# Patient Record
Sex: Female | Born: 1980
Health system: Southern US, Community
[De-identification: ages and names within clinical notes are randomized; demographics above are authoritative.]

## PROBLEM LIST (undated history)

## (undated) ENCOUNTER — Inpatient Hospital Stay (HOSPITAL_COMMUNITY): Payer: Self-pay

## (undated) DIAGNOSIS — K219 Gastro-esophageal reflux disease without esophagitis: Secondary | ICD-10-CM

## (undated) DIAGNOSIS — F329 Major depressive disorder, single episode, unspecified: Secondary | ICD-10-CM

## (undated) DIAGNOSIS — F32A Depression, unspecified: Secondary | ICD-10-CM

## (undated) DIAGNOSIS — R002 Palpitations: Secondary | ICD-10-CM

## (undated) DIAGNOSIS — Z8619 Personal history of other infectious and parasitic diseases: Secondary | ICD-10-CM

## (undated) HISTORY — DX: Palpitations: R00.2

## (undated) HISTORY — DX: Personal history of other infectious and parasitic diseases: Z86.19

## (undated) HISTORY — DX: Major depressive disorder, single episode, unspecified: F32.9

## (undated) HISTORY — DX: Gastro-esophageal reflux disease without esophagitis: K21.9

## (undated) HISTORY — DX: Depression, unspecified: F32.A

## (undated) HISTORY — PX: WISDOM TOOTH EXTRACTION: SHX21

---

## 2004-11-17 ENCOUNTER — Emergency Department (HOSPITAL_COMMUNITY): Admission: EM | Admit: 2004-11-17 | Discharge: 2004-11-17 | Payer: Self-pay | Admitting: Emergency Medicine

## 2007-11-03 HISTORY — PX: SHOULDER BIOPSY: SHX2404

## 2008-06-27 ENCOUNTER — Emergency Department (HOSPITAL_COMMUNITY): Admission: EM | Admit: 2008-06-27 | Discharge: 2008-06-27 | Payer: Self-pay | Admitting: Emergency Medicine

## 2009-08-30 DIAGNOSIS — R000 Tachycardia, unspecified: Secondary | ICD-10-CM

## 2009-08-30 DIAGNOSIS — Z8719 Personal history of other diseases of the digestive system: Secondary | ICD-10-CM

## 2009-08-31 DIAGNOSIS — E7800 Pure hypercholesterolemia, unspecified: Secondary | ICD-10-CM

## 2009-09-01 ENCOUNTER — Ambulatory Visit: Payer: Self-pay | Admitting: Cardiology

## 2009-09-01 DIAGNOSIS — R002 Palpitations: Secondary | ICD-10-CM

## 2009-09-21 ENCOUNTER — Encounter: Payer: Self-pay | Admitting: Cardiology

## 2009-09-21 ENCOUNTER — Ambulatory Visit: Payer: Self-pay | Admitting: Cardiology

## 2009-09-21 ENCOUNTER — Ambulatory Visit: Payer: Self-pay

## 2009-09-21 ENCOUNTER — Encounter (INDEPENDENT_AMBULATORY_CARE_PROVIDER_SITE_OTHER): Payer: Self-pay | Admitting: *Deleted

## 2009-09-21 ENCOUNTER — Ambulatory Visit (HOSPITAL_COMMUNITY): Admission: RE | Admit: 2009-09-21 | Discharge: 2009-09-21 | Payer: Self-pay | Admitting: Cardiology

## 2010-07-03 ENCOUNTER — Inpatient Hospital Stay (HOSPITAL_COMMUNITY)
Admission: AD | Admit: 2010-07-03 | Discharge: 2010-07-03 | Payer: Self-pay | Source: Home / Self Care | Attending: Obstetrics and Gynecology | Admitting: Obstetrics and Gynecology

## 2010-07-03 LAB — KLEIHAUER-BETKE STAIN: Fetal Cells %: 0 %

## 2010-07-04 NOTE — Letter (Signed)
Summary: [facility] - Outpatient Coins[facility] Notice  [facility]   Imported By: Marylou Mccoy 09/22/2009 12:03:25  _____________________________________________________________________  External Attachment:    Type:   Image     Comment:   External Document

## 2010-07-04 NOTE — Assessment & Plan Note (Signed)
Summary: NP6/INTERM TACHYCARDIA/JML   Visit Type:  Initial Consult Referring Provider:  Tomblin   History of Present Illness: Dorothy Aguilar send a day referred by Dr. Henderson Cloud or palpitations.  He's been occurring for several months. 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 Generally occur when she's sitting in her car.  They do not occur with exercise or exertion.  She is a former Warehouse manager. She has not been working out a regular basis. She's gained about 20 some pounds.  She denies any drug use, excess alcohol use, and has cut back on her caffeine.  There is no history of sudden death in her family. There is no premature history of coronary disease.  She denies orthopnea, PND or peripheral edema. She's had no syncope.  Current Medications (verified): 1)  Allegra 180 Mg Tabs (Fexofenadine Hcl) .Marland Kitchen.. 1 By Mouth Daily 2)  Ortho Tri-Cyclen (28) 0.18/0.215/0.25 Mg-35 Mcg Tabs (Norgestim-Eth Estrad Triphasic) .Marland Kitchen.. 1 By Mouth Daily 3)  Zantac 75 75 Mg Tabs (Ranitidine Hcl) .Marland Kitchen.. 1 By Mouth Daily  Allergies (verified): 1)  ! Asa 2)  ! Sulfa 3)  ! Codeine 4)  ! Penicillin  Past History:  Past Medical History: Last updated: 08/30/2009 TACHYCARDIA (ICD-785.0) CORONARY ARTERY DISEASE, FAMILY HX (ICD-V17.3) HYPERLIPIDEMIA, FAMILIAL (ICD-272.0) RECTAL BLEEDING, HX OF (ICD-V12.79)  Past Surgical History: Last updated: 08/30/2009 Knee Arthroscopy..right Hysterectomy Sinus surgery Appendectomy Cyst off the left breast..  Family History: Last updated: 08/30/2009 Family History of Coronary Artery Disease:  Fx h/o Hiatal hernia/peptic ulcer Fx h/o thyroid disease Fx h/o gallbladder disease Fx h/o osteoporosis Fx h/o arthritis Family History of Cancer: Marland KitchenMarland KitchenUterine Family History of Hypertension:   Review of Systems       negative other than history of present illness  Vital Signs:  Patient  profile:   30 year old female Height:      65 inches Weight:      164 pounds BMI:     27.39 Pulse rate:   85 / minute Resp:     16 per minute BP sitting:   112 / 62  (left arm)  Vitals Entered By: Marrion Coy, CNA (September 01, 2009 11:29 AM)  Physical Exam  General:  mildly overweight Head:  normocephalic and atraumatic Eyes:  PERRLA/EOM intact; conjunctiva and lids normal. Mouth:  Teeth, gums and palate normal. Oral mucosa normal. Neck:  Neck supple, no JVD. No masses, thyromegaly or abnormal cervical nodes. Lungs:  Clear bilaterally to auscultation and percussion. Heart:  normal S1-S2, no obvious click, early systolic flow sound at the left lower sternal border.Regular rate and rhythm, PMI nondisplaced Abdomen:  Bowel sounds positive; abdomen soft and non-tender without masses, organomegaly, or hernias noted. No hepatosplenomegaly. Msk:  Back normal, normal gait. Muscle strength and tone normal. Pulses:  pulses normal in all 4 extremities Extremities:  No clubbing or cyanosis. Neurologic:  Alert and oriented x 3. Skin:  Intact without lesions or rashes. Psych:  Normal affect.   Impression & Recommendations:  Problem # 1:  PALPITATIONS (ICD-785.1) Assessment New Her palpitations are most likely benign. Will obtain 2-D echocardiogram to rule out structural heart disease. I do not feel this is electrolyte abnormality or thyroid disease.  She's been advised to have a 2-D echocardiogram. She can resume exercising if this is normal which I'm sure will be. I've also advised that she has a borderline short PR interval which could potentially  increase her risk of SVT in the future. I have explained the symptoms of SVT not respond. We will certainly want to see her back at that time. All questions were answered. Orders: Echocardiogram (Echo)  Other Orders: EKG w/ Interpretation (93000)  Patient Instructions: 1)  Your physician recommends that you schedule a follow-up appointment in:  AS NEEDED 2)  Your physician recommends that you continue on your current medications as directed. Please refer to the Current Medication list given to you today. 3)  Your physician has requested that you have an echocardiogram.  Echocardiography is a painless test that uses sound waves to create images of your heart. It provides your doctor with information about the size and shape of your heart and how well your heart's chambers and valves are working.  This procedure takes approximately one hour. There are no restrictions for this procedure. 4)  Your physician encouraged you to lose weight for better health.

## 2010-07-19 ENCOUNTER — Inpatient Hospital Stay (HOSPITAL_COMMUNITY)
Admission: AD | Admit: 2010-07-19 | Discharge: 2010-07-22 | DRG: 775 | Disposition: A | Discharge status: Home / Self Care | Payer: 59 | Source: Ambulatory Visit | Attending: Obstetrics and Gynecology | Admitting: Obstetrics and Gynecology

## 2010-07-19 DIAGNOSIS — O480 Post-term pregnancy: Principal | ICD-10-CM | POA: Diagnosis present

## 2010-07-19 LAB — CBC
Hemoglobin: 12.3 g/dL (ref 12.0–15.0)
MCH: 31.4 pg (ref 26.0–34.0)
MCHC: 34.1 g/dL (ref 30.0–36.0)

## 2010-07-20 LAB — RPR: RPR Ser Ql: NONREACTIVE

## 2010-07-21 LAB — CBC
HCT: 34.2 % — ABNORMAL LOW (ref 36.0–46.0)
MCH: 31.2 pg (ref 26.0–34.0)
MCHC: 33 g/dL (ref 30.0–36.0)
MCV: 94.5 fL (ref 78.0–100.0)
RDW: 13.3 % (ref 11.5–15.5)

## 2010-07-30 IMAGING — CT CT ABDOMEN W/ CM
2 of 5 series · 17 of 46 positions shown, 19 images · IV contrast (APPLIED)
Comparison: None.

CT ABDOMEN

CLINICAL DATA: 27-year-old female with rectal bleeding and
diarrhea.

CT ABDOMEN AND PELVIS WITH CONTRAST
TECHNIQUE: Multidetector CT imaging of the abdomen and pelvis was
performed using the standard protocol following bolus
administration of intravenous contrast.
Contrast: 100 ml 8mnipaque-SOO.

[Series 2: abd/pelv with 5.0 b31f st · axial · 0.71mm/px · z∈[-492,-102]mm · 14 of 88 slices shown, 16 images]
[im 5/88  soft-tissue]
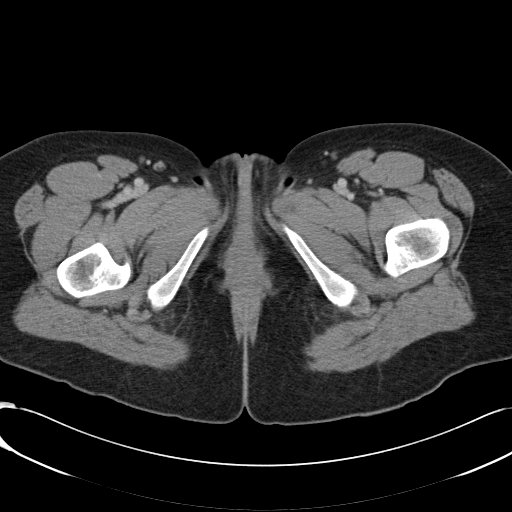
[im 5/88  bone]
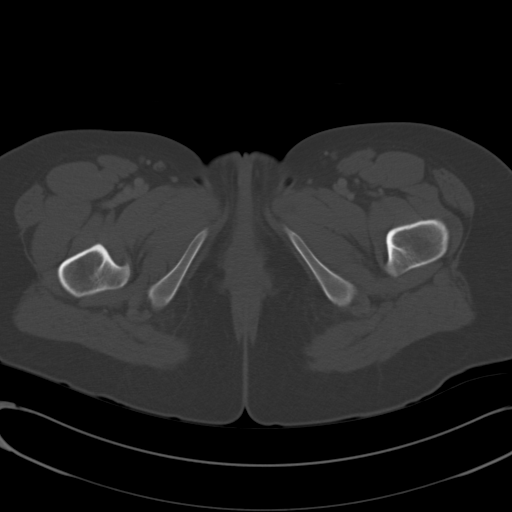
[im 14/88  soft-tissue]
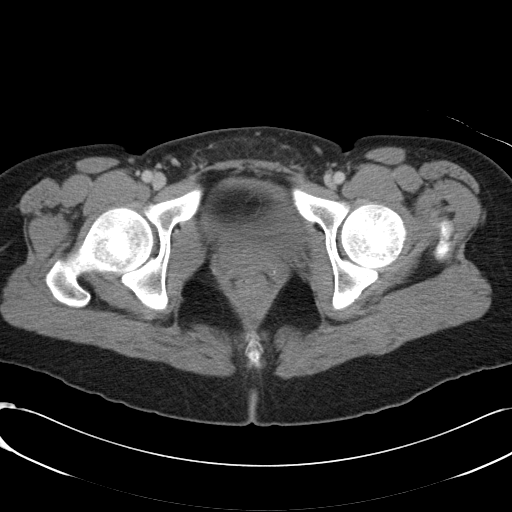
[im 18/88  soft-tissue]
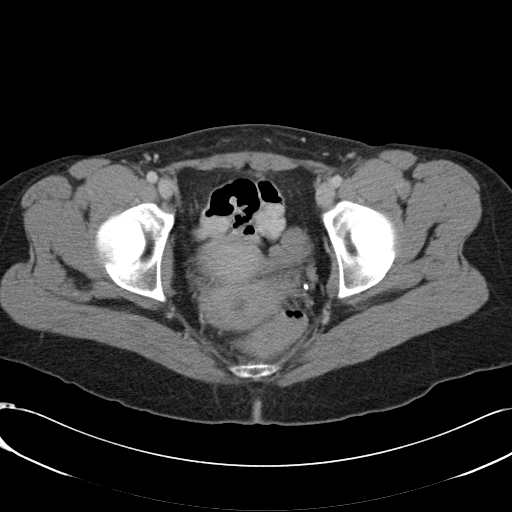
[im 22/88  soft-tissue]
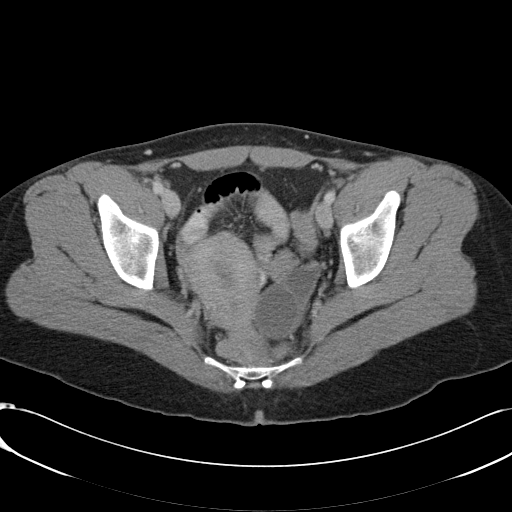
[im 31/88  soft-tissue]
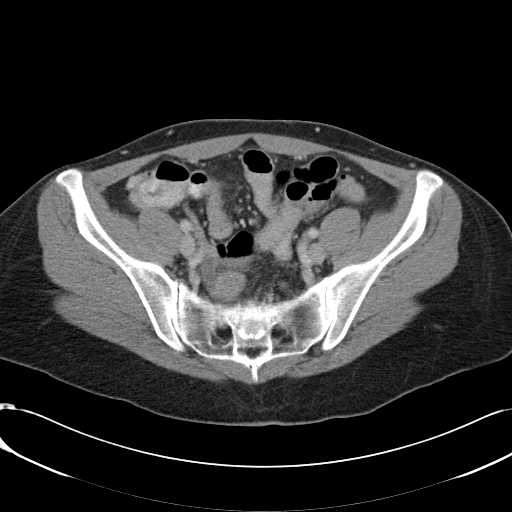
[im 35/88  soft-tissue]
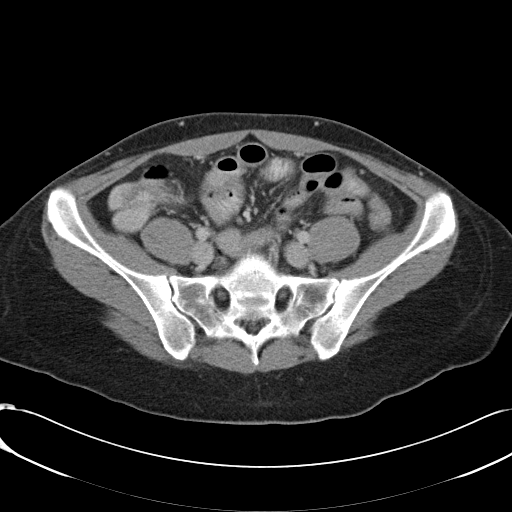
[im 40/88  soft-tissue]
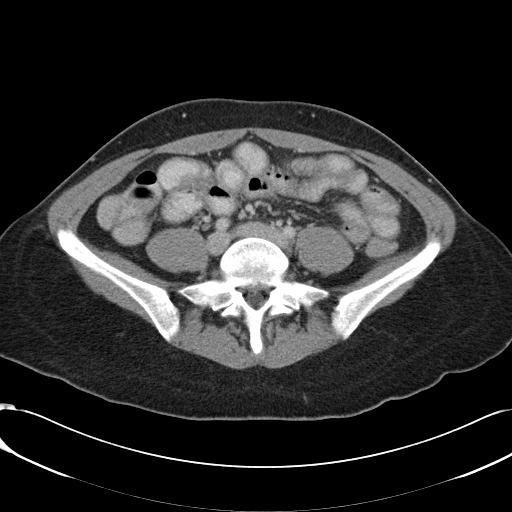
[im 48/88  soft-tissue]
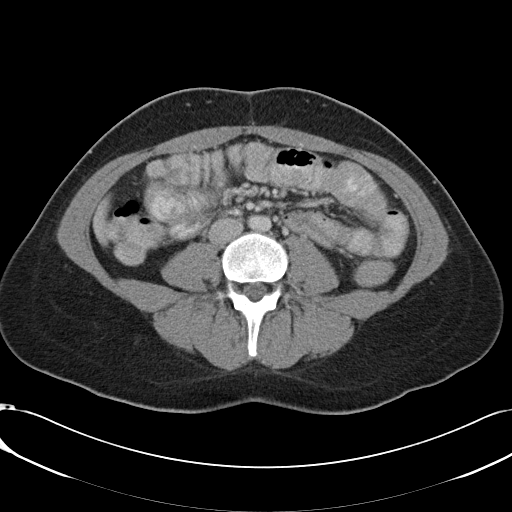
[im 53/88  soft-tissue]
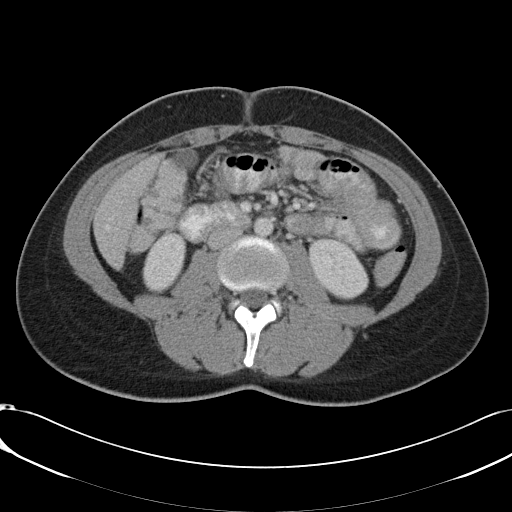
[im 53/88  bone]
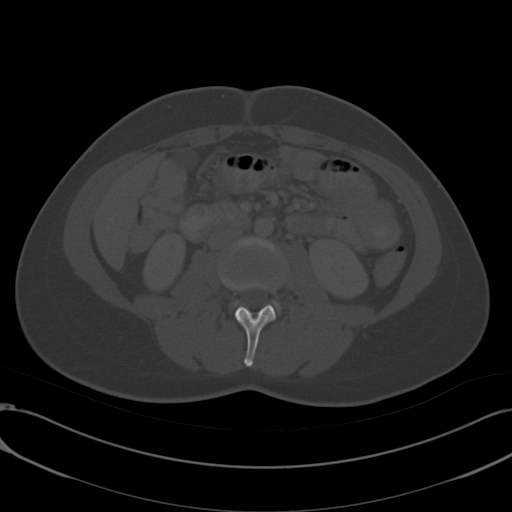
[im 57/88  soft-tissue]
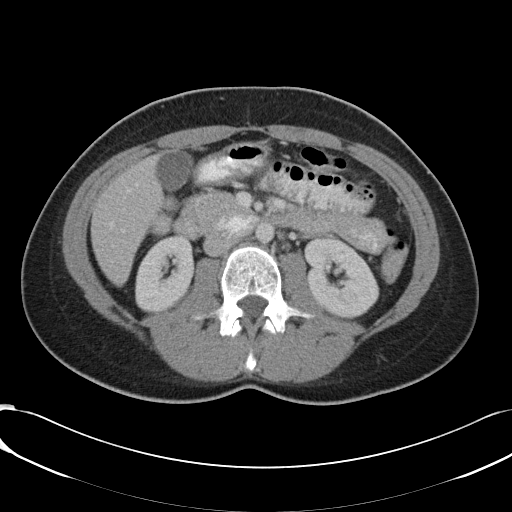
[im 66/88  soft-tissue]
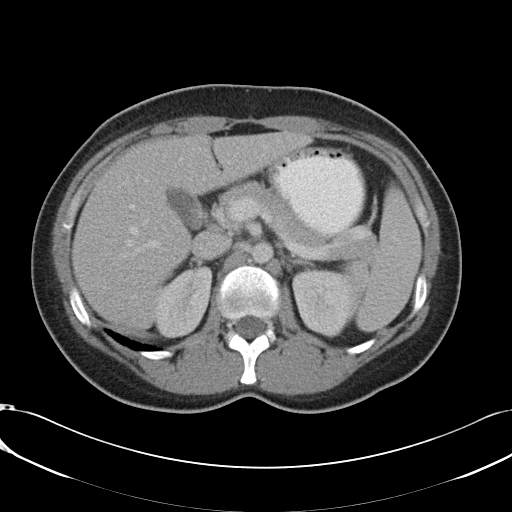
[im 70/88  soft-tissue]
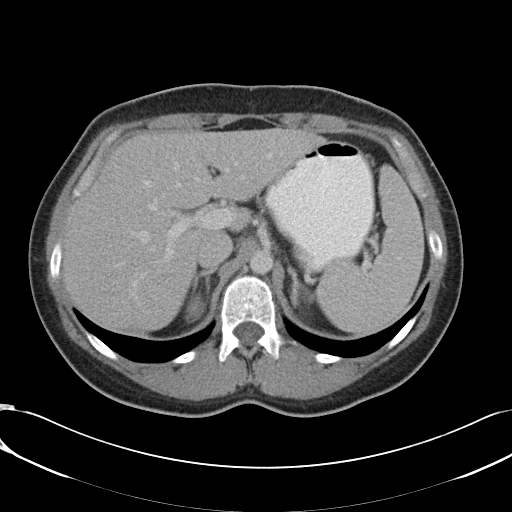
[im 74/88  soft-tissue]
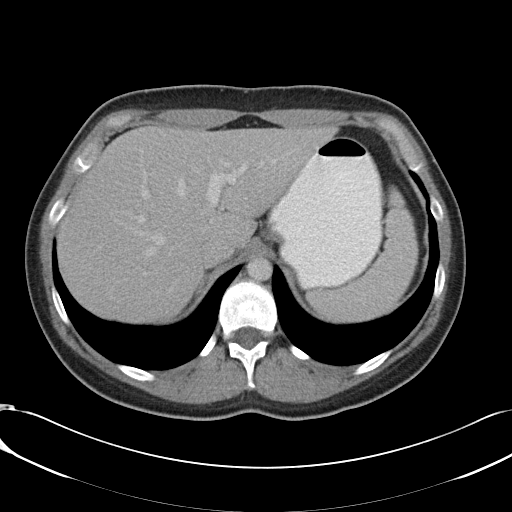
[im 83/88  soft-tissue]
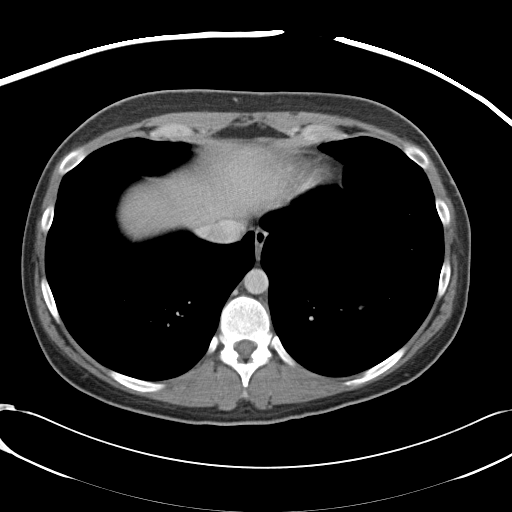

[Series 602: cor · coronal · 0.85mm/px · 3 of 69 slices shown]
[im 23/69  soft-tissue]
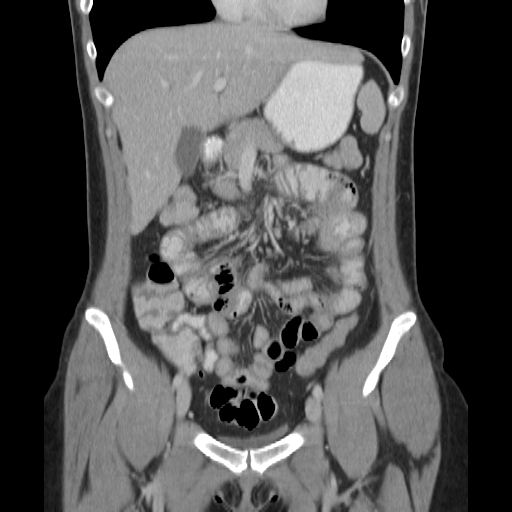
[im 31/69  soft-tissue]
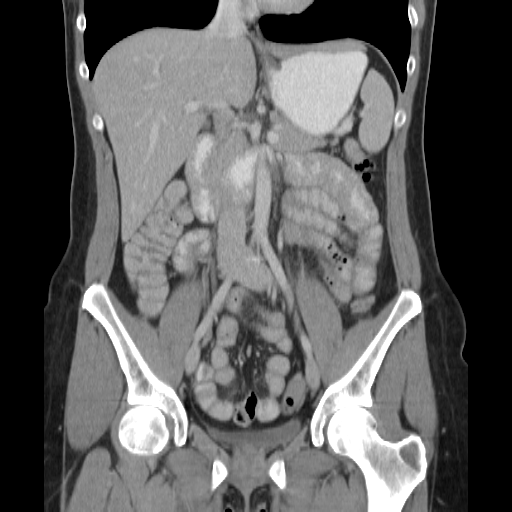
[im 38/69  soft-tissue]
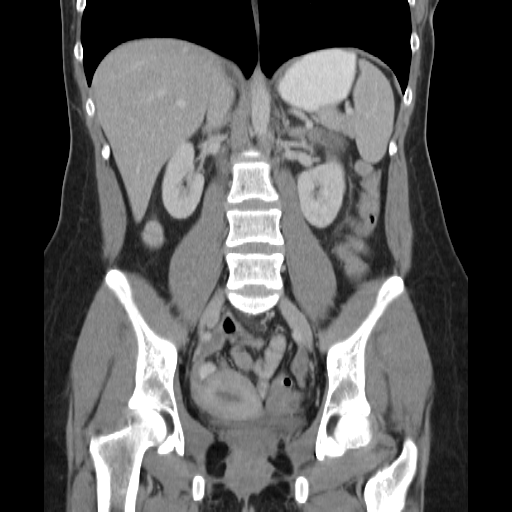

[17 of 46 positions shown; findings below may reference images not displayed]

FINDINGS: Incidental calcified granuloma in the anterior right
lower lobe, otherwise the lung bases are clear. No acute osseous
abnormality identified.  The liver, gallbladder, spleen, pancreas,
adrenal glands, kidneys, stomach, duodenum, and proximal small
bowel loops are within normal limits.  No bowel obstruction.  Oral
contrast has reached the colon.  Visualized distal small bowel
loops and the appendix are normal.  The visualized colon is within
normal limits.  No free fluid, focal bowel wall thickening or
mesenteric inflammatory changes are identified.  The portal venous
system and major abdominal arterial structures are within normal
limits.  No lymphadenopathy.
IMPRESSION: Negative abdomen CT.  See pelvis findings below.

CT PELVIS
FINDINGS: No free fluid identified.  Visualized distal large and
small bowel loops are within normal limits.  Normal right adnexa.
Uterus is within normal limits.  The left adnexa is remarkable for
to simple fluid attenuation cysts measuring 2 and 4 cm in diameter
respectively.  Incidental pelvic phleboliths.  The bladder is
decompressed and within normal limits.  No lymphadenopathy.  Major
pelvic arterial structures are within normal limits. No acute
osseous abnormality identified.  No focal inflammatory changes are
identified.
IMPRESSION: 1.  Left adnexal cystic lesions measuring up to 4 cm in diameter
appear simple by CT.  Generally, follow-up ultrasound in 6-10 weeks
is recommended for cysts larger than 3 cm to evaluate regression.
2.  Otherwise no acute findings in the pelvis.

## 2010-09-18 LAB — BASIC METABOLIC PANEL
BUN: 17 mg/dL (ref 6–23)
Calcium: 8.7 mg/dL (ref 8.4–10.5)
Creatinine, Ser: 0.88 mg/dL (ref 0.4–1.2)
GFR calc non Af Amer: >60 mL/min (ref >60)
Glucose, Bld: 142 mg/dL — ABNORMAL HIGH (ref 70–99)
Sodium: 135 mEq/L (ref 135–145)

## 2010-09-18 LAB — CLOSTRIDIUM DIFFICILE EIA: C difficile Toxins A+B, EIA: NEGATIVE

## 2010-09-18 LAB — URINALYSIS, ROUTINE W REFLEX MICROSCOPIC
Ketones, ur: >80 mg/dL — AB
Nitrite: NEGATIVE
Specific Gravity, Urine: 1.031 — ABNORMAL HIGH (ref 1.005–1.030)
Urobilinogen, UA: 0.2 mg/dL (ref 0.0–1.0)
pH: 6 (ref 5.0–8.0)

## 2010-09-18 LAB — GIARDIA/CRYPTOSPORIDIUM SCREEN(EIA): Cryptosporidium Screen (EIA): NEGATIVE

## 2010-09-18 LAB — DIFFERENTIAL
Basophils Absolute: 0 10*3/uL (ref 0.0–0.1)
Lymphocytes Relative: 4 % — ABNORMAL LOW (ref 12–46)
Neutro Abs: 6.9 10*3/uL (ref 1.7–7.7)
Neutrophils Relative %: 92 % — ABNORMAL HIGH (ref 43–77)

## 2010-09-18 LAB — CBC
Platelets: 184 10*3/uL (ref 150–400)
RDW: 12.2 % (ref 11.5–15.5)

## 2010-09-18 LAB — STOOL CULTURE

## 2012-01-04 LAB — OB RESULTS CONSOLE HIV ANTIBODY (ROUTINE TESTING): HIV: NONREACTIVE

## 2012-01-04 LAB — OB RESULTS CONSOLE ABO/RH: RH Type: POSITIVE

## 2012-01-04 LAB — OB RESULTS CONSOLE GC/CHLAMYDIA
Chlamydia: NEGATIVE
Gonorrhea: NEGATIVE

## 2012-01-04 LAB — OB RESULTS CONSOLE ANTIBODY SCREEN: Antibody Screen: NEGATIVE

## 2012-01-04 LAB — OB RESULTS CONSOLE RPR: RPR: NONREACTIVE

## 2012-05-18 ENCOUNTER — Encounter (HOSPITAL_COMMUNITY): Payer: Self-pay | Admitting: *Deleted

## 2012-05-18 ENCOUNTER — Inpatient Hospital Stay (HOSPITAL_COMMUNITY)
Admission: AD | Admit: 2012-05-18 | Discharge: 2012-05-18 | Disposition: A | Discharge status: Home / Self Care | Payer: 59 | Source: Ambulatory Visit | Attending: Obstetrics and Gynecology | Admitting: Obstetrics and Gynecology

## 2012-05-18 DIAGNOSIS — O228X9 Other venous complications in pregnancy, unspecified trimester: Secondary | ICD-10-CM | POA: Insufficient documentation

## 2012-05-18 DIAGNOSIS — O479 False labor, unspecified: Secondary | ICD-10-CM

## 2012-05-18 DIAGNOSIS — K649 Unspecified hemorrhoids: Secondary | ICD-10-CM | POA: Insufficient documentation

## 2012-05-18 DIAGNOSIS — K644 Residual hemorrhoidal skin tags: Secondary | ICD-10-CM

## 2012-05-18 LAB — WET PREP, GENITAL: Yeast Wet Prep HPF POC: NONE SEEN

## 2012-05-18 LAB — URINALYSIS, ROUTINE W REFLEX MICROSCOPIC
Hgb urine dipstick: NEGATIVE
Ketones, ur: NEGATIVE mg/dL
Protein, ur: NEGATIVE mg/dL
Urobilinogen, UA: 0.2 mg/dL (ref 0.0–1.0)

## 2012-05-18 NOTE — MAU Note (Signed)
Pt reports she noticed some bleeding yesterday when she wiped. Stated she does have a Hemoroid though it might be from that. Had a few more  episodes today  And doctor told her to come in. Stated only pain she really has in her Hemoroid  And some back pain.

## 2012-05-18 NOTE — MAU Note (Signed)
Pt with c/o bleeding since yesterday evening, 1100 AM today, and large amount at 1700 this pm  Pt with known hemorrhoids but was unable to distinguish bleeding this afternoon after attempting to visual. Pt showered and no further bleeding noted.  Called MD office and was told to come in for evaluation.

## 2012-05-18 NOTE — MAU Provider Note (Signed)
Chief Complaint:  Vaginal Bleeding   First Provider Initiated Contact with Patient 05/18/12 2013      HPI: Dorothy Aguilar is a 31 y.o. G2P1001 at [redacted]w[redacted]d who presents to maternity admissions reporting first episode of small amount of blood on the toilet tissue yesterday. Again today she had blood after wiping that was bright red and at 1700 this evening had bright red blood in the toilet after urinating. She has a hemorrhoid with this pregnancy, but as far she knows has never had bleeding from it. Denies itching or irritation of rectum or vagina. Denies irritative vaginal discharge and last intercourse was several weeks ago. Last bowel movement was yesterday. Denies bloody stools. Denies contractions, leakage of fluid. Good fetal movement.   Pregnancy Course: uncomplicated  Past Medical History: History reviewed. No pertinent past medical history.  Past obstetric history: OB History    Grav Para Term Preterm Abortions TAB SAB Ect Mult Living   2 1 1       1      # Outc Date GA Lbr Len/2nd Wgt Sex Del Anes PTL Lv   1 TRM 2/12    M SVD EPI No Yes   2 CUR               Past Surgical History: Past Surgical History  Procedure Date  . Shoulder biopsy 11/2007    Family History: No family history on file.  Social History: History  Substance Use Topics  . Smoking status: Not on file  . Smokeless tobacco: Not on file  . Alcohol Use: Not on file    Allergies:  Allergies  Allergen Reactions  . Aspirin   . Codeine   . Penicillins   . Sulfonamide Derivatives     Meds:  No prescriptions prior to admission    ROS: Pertinent findings in history of present illness.  Physical Exam  Blood pressure 121/68, pulse 77, temperature 97.7 F (36.5 C), temperature source Oral, resp. rate 18, height 5\' 6"  (1.676 m), weight 210 lb 9.6 oz (95.528 kg), unknown if currently breastfeeding. GENERAL: Well-developed, well-nourished female in no acute distress.  HEENT: normocephalic HEART: normal  rate RESP: normal effort ABDOMEN: Soft, non-tender, gravid appropriate for gestational age EXTREMITIES: Nontender, no edema NEURO: alert and oriented SPECULUM EXAM: NEFG, vaginal mucosa ertyhtematous,white somewhat frothy discharge, no blood, cervix clean  VE: long, closed, high Rectal: 1 cm fleshy hemorrhoidal tag; no int hemorrhoids felt; no blood   FHT:  Baseline 140 , moderate variability, 10bpm accelerations present, no decelerations Contractions: none   Labs: Results for orders placed during the hospital encounter of 05/18/12 (from the past 24 hour(s))  URINALYSIS, ROUTINE W REFLEX MICROSCOPIC     Status: Normal   Collection Time   05/18/12  6:47 PM      Component Value Range   Color, Urine YELLOW  YELLOW   APPearance CLEAR  CLEAR   Specific Gravity, Urine 1.010  1.005 - 1.030   pH 5.5  5.0 - 8.0   Glucose, UA NEGATIVE  NEGATIVE mg/dL   Hgb urine dipstick NEGATIVE  NEGATIVE   Bilirubin Urine NEGATIVE  NEGATIVE   Ketones, ur NEGATIVE  NEGATIVE mg/dL   Protein, ur NEGATIVE  NEGATIVE mg/dL   Urobilinogen, UA 0.2  0.0 - 1.0 mg/dL   Nitrite NEGATIVE  NEGATIVE   Leukocytes, UA NEGATIVE  NEGATIVE  WET PREP, GENITAL     Status: Abnormal   Collection Time   05/18/12  8:15 PM  Component Value Range   Yeast Wet Prep HPF POC NONE SEEN  NONE SEEN   Trich, Wet Prep NONE SEEN  NONE SEEN   Clue Cells Wet Prep HPF POC FEW (*) NONE SEEN   WBC, Wet Prep HPF POC FEW (*) NONE SEEN     Assessment: 1. External hemorrhoid     Plan: D/W Dr. Vincente Poli reassure and F/U in office. Come to MAU when bleeding if it reoccurs  Discharge home AVS hemorrhoids Labor precautions and fetal kick counts    Medication List    Notice       You have not been prescribed any medications.          Danae Orleans, CNM 05/18/2012 8:20 PM

## 2012-05-18 NOTE — MAU Note (Signed)
Report to Drue, Charity fundraiser

## 2012-08-01 ENCOUNTER — Inpatient Hospital Stay (HOSPITAL_COMMUNITY)
Admission: AD | Admit: 2012-08-01 | Discharge: 2012-08-01 | Disposition: A | Discharge status: Home / Self Care | Payer: BC Managed Care – PPO | Source: Ambulatory Visit | Attending: Obstetrics and Gynecology | Admitting: Obstetrics and Gynecology

## 2012-08-01 ENCOUNTER — Encounter (HOSPITAL_COMMUNITY): Payer: Self-pay | Admitting: Family

## 2012-08-01 DIAGNOSIS — R109 Unspecified abdominal pain: Secondary | ICD-10-CM | POA: Insufficient documentation

## 2012-08-01 DIAGNOSIS — O99891 Other specified diseases and conditions complicating pregnancy: Secondary | ICD-10-CM | POA: Insufficient documentation

## 2012-08-01 LAB — URINE MICROSCOPIC-ADD ON

## 2012-08-01 LAB — URINALYSIS, ROUTINE W REFLEX MICROSCOPIC
Glucose, UA: NEGATIVE mg/dL
Ketones, ur: NEGATIVE mg/dL
Nitrite: NEGATIVE
Specific Gravity, Urine: 1.025 (ref 1.005–1.030)
pH: 6 (ref 5.0–8.0)

## 2012-08-01 NOTE — MAU Note (Signed)
Patient presents to MAU with c/o lower abdominal pressure since 1300 today. Reports leaking clear fluid since Monday morning. Denies vaginal bleeding.  Had [place] on Monday in office; reports fluid levels were good on Monday.  Reports good fetal movement.

## 2012-08-01 NOTE — MAU Provider Note (Signed)
  History     CSN: [id number]  Arrival date and time: 08/01/12 1419   None     No chief complaint on file.  HPI  Possible ROM  History reviewed. No pertinent past medical history.  Past Surgical History  Procedure Laterality Date  . Shoulder biopsy  11/2007  . Wisdom tooth extraction      History reviewed. No pertinent family history.  History  Substance Use Topics  . Smoking status: Not on file  . Smokeless tobacco: Not on file  . Alcohol Use: Not on file    Allergies:  Allergies  Allergen Reactions  . Aspirin Other (See Comments)    Lips and mouth swelling  . Codeine Swelling  . Penicillins Swelling  . Sulfonamide Derivatives Swelling    Prescriptions prior to admission  Medication Sig Dispense Refill  . acetaminophen (TYLENOL) 500 MG tablet Take 1,000 mg by mouth every 6 (six) hours as needed for pain.      . Prenatal Vit-Fe Fumarate-FA (PRENATAL MULTIVITAMIN) TABS Take 1 tablet by mouth daily at 12 noon.        ROS Physical Exam   Blood pressure 113/66, pulse 80, temperature 97.6 F (36.4 C), temperature source Oral, resp. rate 16, unknown if currently breastfeeding.  Physical Exam  MAU Course  Procedures Speculum exam revealed small amount of thick white discharge - cervix 1-2 cm soft, posterior.  Assessment and Plan    Dorothy Aguilar 08/01/2012, 3:45 PM

## 2012-08-03 LAB — URINE CULTURE

## 2012-08-05 ENCOUNTER — Encounter (HOSPITAL_COMMUNITY): Payer: Self-pay | Admitting: *Deleted

## 2012-08-05 ENCOUNTER — Telehealth (HOSPITAL_COMMUNITY): Payer: Self-pay | Admitting: *Deleted

## 2012-08-05 NOTE — Telephone Encounter (Signed)
Add GBS 

## 2012-08-05 NOTE — Telephone Encounter (Signed)
Preadmission screen  

## 2012-08-13 ENCOUNTER — Inpatient Hospital Stay (HOSPITAL_COMMUNITY): Payer: BC Managed Care – PPO | Admitting: Anesthesiology

## 2012-08-13 ENCOUNTER — Encounter (HOSPITAL_COMMUNITY): Payer: Self-pay

## 2012-08-13 ENCOUNTER — Inpatient Hospital Stay (HOSPITAL_COMMUNITY)
Admission: RE | Admit: 2012-08-13 | Discharge: 2012-08-15 | DRG: 373 | Disposition: A | Discharge status: Home / Self Care | Payer: BC Managed Care – PPO | Source: Ambulatory Visit | Attending: Obstetrics and Gynecology | Admitting: Obstetrics and Gynecology

## 2012-08-13 ENCOUNTER — Encounter (HOSPITAL_COMMUNITY): Payer: Self-pay | Admitting: Anesthesiology

## 2012-08-13 DIAGNOSIS — R000 Tachycardia, unspecified: Secondary | ICD-10-CM

## 2012-08-13 DIAGNOSIS — R002 Palpitations: Secondary | ICD-10-CM

## 2012-08-13 DIAGNOSIS — E7800 Pure hypercholesterolemia, unspecified: Secondary | ICD-10-CM

## 2012-08-13 DIAGNOSIS — Z8719 Personal history of other diseases of the digestive system: Secondary | ICD-10-CM

## 2012-08-13 LAB — CBC
HCT: 34 % — ABNORMAL LOW (ref 36.0–46.0)
Hemoglobin: 11.6 g/dL — ABNORMAL LOW (ref 12.0–15.0)
MCV: 91.6 fL (ref 78.0–100.0)
RBC: 3.71 MIL/uL — ABNORMAL LOW (ref 3.87–5.11)
WBC: 10.9 10*3/uL — ABNORMAL HIGH (ref 4.0–10.5)

## 2012-08-13 LAB — TYPE AND SCREEN

## 2012-08-13 LAB — ABO/RH: ABO/RH(D): A POS

## 2012-08-13 MED ORDER — WITCH HAZEL-GLYCERIN EX PADS: 1.0000 "application " | MEDICATED_PAD | CUTANEOUS | Status: DC | PRN

## 2012-08-13 MED ORDER — LIDOCAINE HCL (PF) 1 % IJ SOLN
INTRAMUSCULAR | Status: DC | PRN
  Administered 2012-08-13 (×2): 4 mL

## 2012-08-13 MED ORDER — FLEET ENEMA 7-19 GM/118ML RE ENEM: 1.0000 | ENEMA | Freq: Every day | RECTAL | Status: DC | PRN

## 2012-08-13 MED ORDER — LANOLIN HYDROUS EX OINT: TOPICAL_OINTMENT | CUTANEOUS | Status: DC | PRN

## 2012-08-13 MED ORDER — OXYTOCIN 40 UNITS IN LACTATED RINGERS INFUSION - SIMPLE MED
1.0000 m[IU]/min | INTRAVENOUS | Status: DC
  Administered 2012-08-13: 8 m[IU]/min via INTRAVENOUS
  Administered 2012-08-13: 2 m[IU]/min via INTRAVENOUS
  Filled 2012-08-13: qty 1000

## 2012-08-13 MED ORDER — TETANUS-DIPHTH-ACELL PERTUSSIS 5-2.5-18.5 LF-MCG/0.5 IM SUSP: 0.5000 mL | Freq: Once | INTRAMUSCULAR | Status: DC

## 2012-08-13 MED ORDER — DIBUCAINE 1 % RE OINT: 1.0000 "application " | TOPICAL_OINTMENT | RECTAL | Status: DC | PRN

## 2012-08-13 MED ORDER — TERBUTALINE SULFATE 1 MG/ML IJ SOLN: 0.2500 mg | Freq: Once | INTRAMUSCULAR | Status: DC | PRN

## 2012-08-13 MED ORDER — PHENYLEPHRINE 40 MCG/ML (10ML) SYRINGE FOR IV PUSH (FOR BLOOD PRESSURE SUPPORT)
80.0000 ug | PREFILLED_SYRINGE | INTRAVENOUS | Status: DC | PRN
  Filled 2012-08-13: qty 5

## 2012-08-13 MED ORDER — FENTANYL 2.5 MCG/ML BUPIVACAINE 1/10 % EPIDURAL INFUSION (WH - ANES)
14.0000 mL/h | INTRAMUSCULAR | Status: DC | PRN
  Filled 2012-08-13: qty 125

## 2012-08-13 MED ORDER — OXYTOCIN 40 UNITS IN LACTATED RINGERS INFUSION - SIMPLE MED: 62.5000 mL/h | INTRAVENOUS | Status: DC

## 2012-08-13 MED ORDER — LORATADINE 10 MG PO TABS
10.0000 mg | ORAL_TABLET | Freq: Every day | ORAL | Status: DC
  Administered 2012-08-13: 10 mg via ORAL
  Filled 2012-08-13 (×2): qty 1

## 2012-08-13 MED ORDER — ZOLPIDEM TARTRATE 5 MG PO TABS: 5.0000 mg | ORAL_TABLET | Freq: Every evening | ORAL | Status: DC | PRN

## 2012-08-13 MED ORDER — PRENATAL MULTIVITAMIN CH
1.0000 | ORAL_TABLET | Freq: Every day | ORAL | Status: DC
  Administered 2012-08-14: 1 via ORAL
  Filled 2012-08-13: qty 1

## 2012-08-13 MED ORDER — FLEET ENEMA 7-19 GM/118ML RE ENEM: 1.0000 | ENEMA | RECTAL | Status: DC | PRN

## 2012-08-13 MED ORDER — BENZOCAINE-MENTHOL 20-0.5 % EX AERO
1.0000 "application " | INHALATION_SPRAY | CUTANEOUS | Status: DC | PRN
  Filled 2012-08-13: qty 56

## 2012-08-13 MED ORDER — SIMETHICONE 80 MG PO CHEW: 80.0000 mg | CHEWABLE_TABLET | ORAL | Status: DC | PRN

## 2012-08-13 MED ORDER — SENNOSIDES-DOCUSATE SODIUM 8.6-50 MG PO TABS
2.0000 | ORAL_TABLET | Freq: Every day | ORAL | Status: DC
  Administered 2012-08-13 – 2012-08-14 (×2): 2 via ORAL

## 2012-08-13 MED ORDER — FENTANYL 2.5 MCG/ML BUPIVACAINE 1/10 % EPIDURAL INFUSION (WH - ANES)
INTRAMUSCULAR | Status: DC | PRN
  Administered 2012-08-13: 14 mL/h via EPIDURAL

## 2012-08-13 MED ORDER — LIDOCAINE HCL (PF) 1 % IJ SOLN: 30.0000 mL | INTRAMUSCULAR | Status: DC | PRN

## 2012-08-13 MED ORDER — ONDANSETRON HCL 4 MG/2ML IJ SOLN: 4.0000 mg | Freq: Four times a day (QID) | INTRAMUSCULAR | Status: DC | PRN

## 2012-08-13 MED ORDER — ONDANSETRON HCL 4 MG PO TABS: 4.0000 mg | ORAL_TABLET | ORAL | Status: DC | PRN

## 2012-08-13 MED ORDER — ACETAMINOPHEN 325 MG PO TABS
650.0000 mg | ORAL_TABLET | ORAL | Status: DC | PRN
  Administered 2012-08-13: 650 mg via ORAL
  Filled 2012-08-13: qty 2

## 2012-08-13 MED ORDER — CITRIC ACID-SODIUM CITRATE 334-500 MG/5ML PO SOLN: 30.0000 mL | ORAL | Status: DC | PRN

## 2012-08-13 MED ORDER — LACTATED RINGERS IV SOLN
500.0000 mL | Freq: Once | INTRAVENOUS | Status: AC
  Administered 2012-08-13: 500 mL via INTRAVENOUS

## 2012-08-13 MED ORDER — EPHEDRINE 5 MG/ML INJ: 10.0000 mg | INTRAVENOUS | Status: DC | PRN

## 2012-08-13 MED ORDER — FENTANYL 2.5 MCG/ML BUPIVACAINE 1/10 % EPIDURAL INFUSION (WH - ANES): 14.0000 mL/h | INTRAMUSCULAR | Status: DC | PRN

## 2012-08-13 MED ORDER — PHENYLEPHRINE 40 MCG/ML (10ML) SYRINGE FOR IV PUSH (FOR BLOOD PRESSURE SUPPORT): 80.0000 ug | PREFILLED_SYRINGE | INTRAVENOUS | Status: DC | PRN

## 2012-08-13 MED ORDER — BISACODYL 10 MG RE SUPP: 10.0000 mg | Freq: Every day | RECTAL | Status: DC | PRN

## 2012-08-13 MED ORDER — DIPHENHYDRAMINE HCL 25 MG PO CAPS: 25.0000 mg | ORAL_CAPSULE | Freq: Four times a day (QID) | ORAL | Status: DC | PRN

## 2012-08-13 MED ORDER — LACTATED RINGERS IV SOLN: 500.0000 mL | INTRAVENOUS | Status: DC | PRN

## 2012-08-13 MED ORDER — OXYTOCIN BOLUS FROM INFUSION
500.0000 mL | INTRAVENOUS | Status: DC
  Administered 2012-08-13: 500 mL via INTRAVENOUS

## 2012-08-13 MED ORDER — ONDANSETRON HCL 4 MG/2ML IJ SOLN: 4.0000 mg | INTRAMUSCULAR | Status: DC | PRN

## 2012-08-13 MED ORDER — LACTATED RINGERS IV SOLN
INTRAVENOUS | Status: DC
  Administered 2012-08-13: 950 mL via INTRAVENOUS
  Administered 2012-08-13: 1000 mL via INTRAVENOUS

## 2012-08-13 MED ORDER — DIPHENHYDRAMINE HCL 50 MG/ML IJ SOLN: 12.5000 mg | INTRAMUSCULAR | Status: DC | PRN

## 2012-08-13 MED ORDER — EPHEDRINE 5 MG/ML INJ
10.0000 mg | INTRAVENOUS | Status: DC | PRN
  Filled 2012-08-13: qty 4

## 2012-08-13 MED ORDER — ACETAMINOPHEN 500 MG PO TABS
1000.0000 mg | ORAL_TABLET | Freq: Four times a day (QID) | ORAL | Status: DC | PRN
  Administered 2012-08-13 – 2012-08-15 (×4): 1000 mg via ORAL
  Filled 2012-08-13: qty 1
  Filled 2012-08-13 (×3): qty 2

## 2012-08-13 NOTE — H&P (Signed)
Dorothy Aguilar is a 32 y.o. female presenting for induction of labor at term with favorable cervix. Maternal Medical History:  Fetal activity: Perceived fetal activity is normal.      OB History   Grav Para Term Preterm Abortions TAB SAB Ect Mult Living   2 1 1       1      Past Medical History  Diagnosis Date  . Depression     mild pp depression  . Hx of varicella   . Palpitations   . GERD (gastroesophageal reflux disease)    Past Surgical History  Procedure Laterality Date  . Shoulder biopsy  11/2007  . Wisdom tooth extraction     Family History: family history includes Hypertension in her maternal grandfather, maternal grandmother, and mother; Osteoporosis in her maternal grandmother, mother, and paternal grandmother; and Thyroid disease in her maternal grandmother and mother. Social History:  does not have a smoking history on file. She has never used smokeless tobacco. She reports that she does not drink alcohol or use illicit drugs.   Prenatal Transfer Tool  Maternal Diabetes: No Genetic Screening: Normal Maternal Ultrasounds/Referrals: Normal Fetal Ultrasounds or other Referrals:  None Maternal Substance Abuse:  No Significant Maternal Medications:  None Significant Maternal Lab Results:  None Other Comments:  None  Review of Systems  HENT:       Mild frontal sinus HA No blurry vision  Eyes: Negative for blurred vision.  Neurological: Positive for headaches.    Dilation: 3 Effacement (%): 70 Station: -3 Exam by:: Tomblin Blood pressure 114/78, pulse 92, temperature 97.8 F (36.6 C), temperature source Oral, resp. rate 18, height 5\' 6"  (1.676 m), weight 225 lb (102.059 kg), unknown if currently breastfeeding. Maternal Exam:  Uterine Assessment: Contraction strength is mild.  Contraction frequency is irregular.   Abdomen: Fetal presentation: vertex     Fetal Exam Fetal Monitor Review: Pattern: accelerations present.       Physical Exam   Cardiovascular: Normal rate and regular rhythm.   Respiratory: Effort normal.  GI: Soft. There is no tenderness.  Neurological: She has normal reflexes.    Prenatal labs: ABO, Rh: A/Positive/-- (08/02 0000) Antibody: Negative (08/02 0000) Rubella: Immune (08/02 0000) RPR: Nonreactive (08/02 0000)  HBsAg: Negative (08/02 0000)  HIV: Non-reactive (08/02 0000)  GBS: Negative (03/04 0000)   Assessment/Plan: 32 yo G2P1 at 75 3/7 weeks for induction of labor. Risks reviewed.   TOMBLIN II,JAMES E 08/13/2012, 7:56 AM

## 2012-08-13 NOTE — Progress Notes (Signed)
FHT + accels UCs difficult to trace, about q3-4 min Cx no change AROM clear vtx at -2 IUPC placed Pit on  Epidural in

## 2012-08-13 NOTE — Anesthesia Procedure Notes (Signed)
Epidural Patient location during procedure: OB Start time: 08/13/2012 12:45 PM  Staffing Anesthesiologist: Asheley Hellberg A. Performed by: anesthesiologist   Preanesthetic Checklist Completed: patient identified, site marked, surgical consent, pre-op evaluation, timeout performed, IV checked, risks and benefits discussed and monitors and equipment checked  Epidural Patient position: sitting Prep: site prepped and draped and DuraPrep Patient monitoring: continuous pulse ox and blood pressure Approach: midline Injection technique: LOR air  Needle:  Needle type: Tuohy  Needle gauge: 17 G Needle length: 9 cm and 9 Needle insertion depth: 6 cm Catheter type: closed end flexible Catheter size: 19 Gauge Catheter at skin depth: 11 cm Test dose: negative and Other  Assessment Events: blood not aspirated, injection not painful, no injection resistance, negative IV test and no paresthesia  Additional Notes Patient identified. Risks and benefits discussed including failed block, incomplete  Pain control, post dural puncture headache, nerve damage, paralysis, blood pressure Changes, nausea, vomiting, reactions to medications-both toxic and allergic and post Partum back pain. All questions were answered. Patient expressed understanding and wished to proceed. Sterile technique was used throughout procedure. Epidural site was Dressed with sterile barrier dressing. No paresthesias, signs of intravascular injection Or signs of intrathecal spread were encountered.  Patient was more comfortable after the epidural was dosed. Please see RN's note for documentation of vital signs and FHR which are stable.

## 2012-08-13 NOTE — Progress Notes (Signed)
Delivery Note At 4:17 PM a viable female was delivered via Vaginal, Spontaneous Delivery (Presentation: Right Occiput Anterior).  APGAR: , ; weight .   Placenta status: Intact, Spontaneous.  Cord: 3 vessels with the following complications: None.  Cord pH: pending  Anesthesia: Epidural  Episiotomy: None Lacerations: 2nd degree Suture Repair: vicryl rapide Est. Blood Loss (mL): 350  Mom to postpartum.  Baby to nursery-stable. Cord Blood collection done at patient request Matti Killingsworth II,Danford Tat E 08/13/2012, 4:41 PM

## 2012-08-13 NOTE — Anesthesia Preprocedure Evaluation (Signed)
Anesthesia Evaluation  Patient identified by MRN, date of birth, ID band Patient awake    Reviewed: Allergy & Precautions, H&P , Patient's Chart, lab work & pertinent test results  Airway Mallampati: III TM Distance: >3 FB Neck ROM: Full    Dental no notable dental hx. (+) Teeth Intact   Pulmonary neg pulmonary ROS,  breath sounds clear to auscultation  Pulmonary exam normal       Cardiovascular negative cardio ROS  Rhythm:Regular Rate:Normal     Neuro/Psych PSYCHIATRIC DISORDERS Depression negative neurological ROS     GI/Hepatic Neg liver ROS, GERD-  Medicated and Controlled,  Endo/Other  Familial Hyperlipidemia  Renal/GU negative Renal ROS  negative genitourinary   Musculoskeletal negative musculoskeletal ROS (+)   Abdominal (+) + obese,   Peds  Hematology negative hematology ROS (+)   Anesthesia Other Findings   Reproductive/Obstetrics (+) Pregnancy                           Anesthesia Physical Anesthesia Plan  ASA: II  Anesthesia Plan: Epidural   Post-op Pain Management:    Induction:   Airway Management Planned: Natural Airway  Additional Equipment:   Intra-op Plan:   Post-operative Plan:   Informed Consent: I have reviewed the patients History and Physical, chart, labs and discussed the procedure including the risks, benefits and alternatives for the proposed anesthesia with the patient or authorized representative who has indicated his/her understanding and acceptance.   Dental advisory given  Plan Discussed with: Anesthesiologist  Anesthesia Plan Comments:         Anesthesia Quick Evaluation

## 2012-08-14 LAB — CBC
HCT: 34.1 % — ABNORMAL LOW (ref 36.0–46.0)
MCH: 30.7 pg (ref 26.0–34.0)
MCHC: 33.4 g/dL (ref 30.0–36.0)
MCV: 91.9 fL (ref 78.0–100.0)
Platelets: 164 10*3/uL (ref 150–400)
RDW: 13.9 % (ref 11.5–15.5)
WBC: 12.4 10*3/uL — ABNORMAL HIGH (ref 4.0–10.5)

## 2012-08-14 NOTE — Progress Notes (Signed)
Pt. Called RN to room to describe change in perineal stitch. Pt described a "sever" or "change in how tightly the stitch was holding the tear together". Saw increased bright right blood on pad 5-70mL which was a change from earlier today where it was becoming scant in appearance and more serous. Fundus firm at umbilicus. VSS. Vaginal and perineum inspected by RN and increased bright red blood noted at posterior area of stitch. Appeared to have changed from AM assessment, but area remains slightly edematous and blood may be from vagina. As a precaution, MD on call (Dr. Renaldo Fiddler) paged at 1850.

## 2012-08-14 NOTE — Progress Notes (Signed)
Post Partum Day 1 Subjective: no complaints, up ad lib and tolerating PO  Objective: Blood pressure 109/72, pulse 80, temperature 98.3 F (36.8 C), temperature source Oral, resp. rate 18, height 5\' 6"  (1.676 m), weight 102.059 kg (225 lb), SpO2 95.00%, unknown if currently breastfeeding.  Physical Exam:  General: alert and cooperative Lochia: appropriate Uterine Fundus: firm Incision: n/a DVT Evaluation: No evidence of DVT seen on physical exam.   Recent Labs  08/13/12 0730 08/14/12 0625  HGB 11.6* 11.4*  HCT 34.0* 34.1*    Assessment/Plan: Plan for discharge tomorrow   LOS: 1 day   ADKINS,GRETCHEN 08/14/2012, 8:26 AM

## 2012-08-14 NOTE — Anesthesia Postprocedure Evaluation (Signed)
  Anesthesia Post-op Note  Patient: Dorothy Aguilar  Procedure(s) Performed: * No procedures listed *  Patient Location: Mother/Baby  Anesthesia Type:Epidural  Level of Consciousness: awake  Airway and Oxygen Therapy: Patient Spontanous Breathing  Post-op Pain: none  Post-op Assessment: Patient's Cardiovascular Status Stable, Respiratory Function Stable, Patent Airway, No signs of Nausea or vomiting, Adequate PO intake, Pain level controlled, No headache, No backache, No residual numbness and No residual motor weakness  Post-op Vital Signs: Reviewed and stable  Complications: No apparent anesthesia complications

## 2012-08-15 NOTE — Discharge Summary (Signed)
Obstetric Discharge Summary Reason for Admission: induction of labor Prenatal Procedures: ultrasound Intrapartum Procedures: spontaneous vaginal delivery Postpartum Procedures: none Complications-Operative and Postpartum: 2 degree perineal laceration Hemoglobin  Date Value Range Status  08/14/2012 11.4* 12.0 - 15.0 g/dL Final     HCT  Date Value Range Status  08/14/2012 34.1* 36.0 - 46.0 % Final    Physical Exam:  General: alert and cooperative Lochia: appropriate Uterine Fundus: firm Incision: perineum intact, small hemorroid DVT Evaluation: No evidence of DVT seen on physical exam. No significant calf/ankle edema.  Discharge Diagnoses: Term Pregnancy-delivered  Discharge Information: Date: 08/15/2012 Activity: pelvic rest Diet: routine Medications: PNV and Colace Condition: improved Instructions: refer to practice specific booklet Discharge to: home   Newborn Data: Live born female  Birth Weight: 7 lb 14.5 oz (3586 g) APGAR: 9, 9  Home with mother.  CURTIS,CAROL G 08/15/2012, 8:26 AM

## 2014-04-05 ENCOUNTER — Encounter (HOSPITAL_COMMUNITY): Payer: Self-pay

## 2016-03-15 ENCOUNTER — Encounter: Payer: Self-pay | Admitting: Allergy and Immunology

## 2016-03-15 ENCOUNTER — Ambulatory Visit (INDEPENDENT_AMBULATORY_CARE_PROVIDER_SITE_OTHER): Payer: 59 | Admitting: Allergy and Immunology

## 2016-03-15 VITALS — BP 104/74 | HR 76 | Temp 98.2°F | Resp 16 | Ht 65.35 in | Wt 162.3 lb

## 2016-03-15 DIAGNOSIS — L858 Other specified epidermal thickening: Secondary | ICD-10-CM | POA: Insufficient documentation

## 2016-03-15 DIAGNOSIS — T7800XA Anaphylactic reaction due to unspecified food, initial encounter: Secondary | ICD-10-CM

## 2016-03-15 DIAGNOSIS — Z872 Personal history of diseases of the skin and subcutaneous tissue: Secondary | ICD-10-CM | POA: Insufficient documentation

## 2016-03-15 DIAGNOSIS — J3089 Other allergic rhinitis: Secondary | ICD-10-CM | POA: Diagnosis not present

## 2016-03-15 MED ORDER — LEVOCETIRIZINE DIHYDROCHLORIDE 5 MG PO TABS: 5.0000 mg | ORAL_TABLET | Freq: Every day | ORAL | 5 refills | Status: DC | PRN

## 2016-03-15 MED ORDER — FLUTICASONE PROPIONATE 50 MCG/ACT NA SUSP: 2.0000 | Freq: Every day | NASAL | 5 refills | Status: DC | PRN

## 2016-03-15 NOTE — Assessment & Plan Note (Addendum)
   Aeroallergen avoidance measures have been discussed and provided in written form.  A prescription has been provided for levocetirizine, 5mg  daily as needed.  A prescription has been provided for fluticasone nasal spray, 2 sprays per nostril daily as needed. Proper nasal spray technique has been discussed and demonstrated.  I have also recommended nasal saline spray (i.e., Simply Saline) or nasal saline lavage (i.e., NeilMed) as needed prior to medicated nasal sprays. The risks and benefits of aeroallergen immunotherapy have been discussed. The patient is motivated to initiate immunotherapy if insurance coverage is favorable. She will let us know how she would like to proceed.

## 2016-03-15 NOTE — Assessment & Plan Note (Signed)
The patient's history and physical exam suggest keratosis pilaris. Reassurance has been provided that keratosis pilaris does not have long-term health implications, occurs in otherwise healthy people, and treatment usually isn't necessary. Keratosis pilaris may become inflamed with exercise, heat, or emotion.   Information regarding keratosis pilaris was discussed, questions were answered and written information was provided. 

## 2016-03-15 NOTE — Patient Instructions (Addendum)
Food allergy The patient's history suggests shellfish and tree nut allergy and positive skin test results today confirm this diagnosis.  Meticulous avoidance of shellfish and tree nuts as discussed.  A prescription has been provided for epinephrine auto-injector 2 pack along with instructions for proper administration.  A food allergy action plan has been provided and discussed.  Medic Alert identification is recommended.  Perennial and seasonal allergic rhinitis  Aeroallergen avoidance measures have been discussed and provided in written form.  A prescription has been provided for levocetirizine, 5mg  daily as needed.  A prescription has been provided for fluticasone nasal spray, 2 sprays per nostril daily as needed. Proper nasal spray technique has been discussed and demonstrated.  I have also recommended nasal saline spray (i.e., Simply Saline) or nasal saline lavage (i.e., NeilMed) as needed prior to medicated nasal sprays. The risks and benefits of aeroallergen immunotherapy have been discussed. The patient is motivated to initiate immunotherapy if insurance coverage is favorable. She will let us know how she would like to proceed.  History of urticaria/angioedema Quiescent over the past several years.  Should symptoms return, take levocetirizine 5 mg daily as needed and keep a detailed symptom/exposure journal.  Keratosis pilaris The patient's history and physical exam suggest keratosis pilaris. Reassurance has been provided that keratosis pilaris does not have long-term health implications, occurs in otherwise healthy people, and treatment usually isn't necessary. Keratosis pilaris may become inflamed with exercise, heat, or emotion.   Information regarding keratosis pilaris was discussed, questions were answered and written information was provided.   Return in about 4 months (around 07/16/2016), or if symptoms worsen or fail to improve.  Reducing Pollen Exposure  The  American Academy of Allergy, Asthma and Immunology suggests the following steps to reduce your exposure to pollen during allergy seasons.    1. Do not hang sheets or clothing out to dry; pollen may collect on these items. 2. Do not mow lawns or spend time around freshly cut grass; mowing stirs up pollen. 3. Keep windows closed at night.  Keep car windows closed while driving. 4. Minimize morning activities outdoors, a time when pollen counts are usually at their highest. 5. Stay indoors as much as possible when pollen counts or humidity is high and on windy days when pollen tends to remain in the air longer. 6. Use air conditioning when possible.  Many air conditioners have filters that trap the pollen spores. 7. Use a HEPA room air filter to remove pollen form the indoor air you breathe.   Control of House Dust Mite Allergen  House dust mites play a major role in allergic asthma and rhinitis.  They occur in environments with high humidity wherever human skin, the food for dust mites is found. High levels have been detected in dust obtained from mattresses, pillows, carpets, upholstered furniture, bed covers, clothes and soft toys.  The principal allergen of the house dust mite is found in its feces.  A gram of dust may contain 1,000 mites and 250,000 fecal particles.  Mite antigen is easily measured in the air during house cleaning activities.    1. Encase mattresses, including the box spring, and pillow, in an air tight cover.  Seal the zipper end of the encased mattresses with wide adhesive tape. 2. Wash the bedding in water of 130 degrees Farenheit weekly.  Avoid cotton comforters/quilts and flannel bedding: the most ideal bed covering is the dacron comforter. 3. Remove all upholstered furniture from the bedroom. 4. Remove carpets, carpet  padding, rugs, and non-washable window drapes from the bedroom.  Wash drapes weekly or use plastic window coverings. 5. Remove all non-washable stuffed toys  from the bedroom.  Wash stuffed toys weekly. 6. Have the room cleaned frequently with a vacuum cleaner and a damp dust-mop.  The patient should not be in a room which is being cleaned and should wait 1 hour after cleaning before going into the room. 7. Close and seal all heating outlets in the bedroom.  Otherwise, the room will become filled with dust-laden air.  An electric heater can be used to heat the room. Reduce indoor humidity to less than 50%.  Do not use a humidifier.  Control of Dog or Cat Allergen  Avoidance is the best way to manage a dog or cat allergy. If you have a dog or cat and are allergic to dog or cats, consider removing the dog or cat from the home. If you have a dog or cat but don't want to find it a new home, or if your family wants a pet even though someone in the household is allergic, here are some strategies that may help keep symptoms at bay:  1. Keep the pet out of your bedroom and restrict it to only a few rooms. Be advised that keeping the dog or cat in only one room will not limit the allergens to that room. 2. Don't pet, hug or kiss the dog or cat; if you do, wash your hands with soap and water. 3. High-efficiency particulate air (HEPA) cleaners run continuously in a bedroom or living room can reduce allergen levels over time. 4. Regular use of a high-efficiency vacuum cleaner or a central vacuum can reduce allergen levels. 5. Giving your dog or cat a bath at least once a week can reduce airborne allergen.  Control of Mold Allergen  Mold and fungi can grow on a variety of surfaces provided certain temperature and moisture conditions exist.  Outdoor molds grow on plants, decaying vegetation and soil.  The major outdoor mold, Alternaria and Cladosporium, are found in very high numbers during hot and dry conditions.  Generally, a late Summer - Fall peak is seen for common outdoor fungal spores.  Rain will temporarily lower outdoor mold spore count, but counts rise  rapidly when the rainy period ends.  The most important indoor molds are Aspergillus and Penicillium.  Dark, humid and poorly ventilated basements are ideal sites for mold growth.  The next most common sites of mold growth are the bathroom and the kitchen.  Outdoor [employer] 1. Use air conditioning and keep windows closed 2. Avoid exposure to decaying vegetation. 3. Avoid leaf raking. 4. Avoid grain handling. 5. Consider wearing a face mask if working in moldy areas.  Indoor Mold Control 1. Maintain humidity below 50%. 2. Clean washable surfaces with 5% bleach solution. 3. Remove sources e.g. Contaminated carpets.  Control of Cockroach Allergen  Cockroach allergen has been identified as an important cause of acute attacks of asthma, especially in urban settings.  There are fifty-five species of cockroach that exist in the [place], however only three, the [place], [place] species produce allergen that can affect patients with Asthma.  Allergens can be obtained from fecal particles, egg casings and secretions from cockroaches.    1. Remove food sources. 2. Reduce access to water. 3. Seal access and entry points. 4. Spray runways with 0.5-1% Diazinon or Chlorpyrifos 5. Blow boric acid power under stoves and refrigerator. 6. Place  bait stations (hydramethylnon) at feeding sites.   Keratosis pilaris  Signs and symptoms Keratosis pilaris is a harmless skin disorder that causes small, acne-like bumps. Although it isn't serious, keratosis pilaris can be frustrating because it's difficult to treat.  Keratosis pilaris results from a buildup of protein called keratin in the openings of hair follicles in the skin. This produces small, rough patches, usually on the arms and thighs, and can give skin a goose flesh or sandpaper appearance.   They usually don't hurt or itch. Typically, patches are skin colored, but they can, at times, be red and inflamed. Keratosis pilaris can  also appear on the face, where it closely resembles acne. The small size of the bumps and its association with dry, chapped skin distinguish keratosis pilaris from pustular acne. Unlike elsewhere on the body, keratosis pilaris on the face may leave small scars. Though quite common with young children, keratosis pilaris can occur at any age.  It may improve, especially during the summer months, only to later worsen. Dry skin tends to worsen the condition.  Gradually, keratosis pilaris resolves on its own.  Many people are bothered by the goose flesh appearance of keratosis pilaris, but it doesn't have long-term health implications and occurs in otherwise healthy people.  Keratosis pilaris isn't a serious medical condition, and treatment usually isn't necessary.  Treatment No single treatment universally improves keratosis pilaris. But most options, including self-care measures and medicated creams, focus on softening the keratin deposits in the skin.  Self-care Although self-help measures won't cure keratosis pilaris, they may help improve the appearance of your skin. You may find these measures beneficial: . Be gentle when washing your skin. Vigorous scrubbing or removal of the plugs may only irritate your skin and aggravate the condition.  . After washing or bathing, gently pat or blot your skin dry with a towel so that some moisture remains on the skin.  Marland Kitchen. Apply the moisturizing lotion or lubricating cream while your skin is still moist from bathing. Choose a moisturizer that contains urea or propylene glycol, chemicals that soften dry, rough skin.  Marland Kitchen. Apply an over-the-counter product that contains lactic acid twice daily. Lactic acid helps remove extra keratin from the surface of the skin.  . Use a humidifier to add moisture to the air inside your home. Low humidity dries out your skin.

## 2016-03-15 NOTE — Progress Notes (Signed)
New Patient Note  RE: Dorothy Aguilar MRN: [id number] DOB: 06/06/1980 Date of Office Visit: 03/15/2016  Referring provider: Juanita Lasteripton, John S, MD Primary care provider: Fredderick SeveranceIPTON, JOHN, MD  Chief Complaint: Food Allergy; Allergic Rhinitis ; and Rash   History of present illness: Dorothy Aguilar is a 35 y.o. female presenting today for consultation of possible food allergies and rhinitis.  She reports that when she was in college she developed recurrent episodes of urticaria and angioedema.  She was found to be skin test positive to shellfish and tree nuts.  She has avoided these foods carefully in the interval since that time and has not had recurrence of urticaria or angioedema.  She experiences frequent nasal congestion, rhinorrhea, sneezing, postnasal drainage, pharyngeal pruritus, and occasional ocular pruritus.  The symptoms occur year around.  Specific triggers include exposure to cats and pollens.  She also complains of tiny bumps which occur periodically on her forehead.  The bumps are flesh-colored, non-pruritic and nonpainful.   Assessment and plan: Food allergy The patient's history suggests shellfish and tree nut allergy and positive skin test results today confirm this diagnosis.  Meticulous avoidance of shellfish and tree nuts as discussed.  A prescription has been provided for epinephrine auto-injector 2 pack along with instructions for proper administration.  A food allergy action plan has been provided and discussed.  Medic Alert identification is recommended.  Perennial and seasonal allergic rhinitis  Aeroallergen avoidance measures have been discussed and provided in written form.  A prescription has been provided for levocetirizine, 5mg  daily as needed.  A prescription has been provided for fluticasone nasal spray, 2 sprays per nostril daily as needed. Proper nasal spray technique has been discussed and demonstrated.  I have also recommended nasal saline spray (i.e.,  Simply Saline) or nasal saline lavage (i.e., NeilMed) as needed prior to medicated nasal sprays. The risks and benefits of aeroallergen immunotherapy have been discussed. The patient is motivated to initiate immunotherapy if insurance coverage is favorable. She will let us know how she would like to proceed.  History of urticaria/angioedema Quiescent over the past several years.  Should symptoms return, take levocetirizine 5 mg daily as needed and keep a detailed symptom/exposure journal.  Keratosis pilaris The patient's history and physical exam suggest keratosis pilaris. Reassurance has been provided that keratosis pilaris does not have long-term health implications, occurs in otherwise healthy people, and treatment usually isn't necessary. Keratosis pilaris may become inflamed with exercise, heat, or emotion.   Information regarding keratosis pilaris was discussed, questions were answered and written information was provided.   Meds ordered this encounter  Medications  . levocetirizine (XYZAL) 5 MG tablet    Sig: Take 1 tablet (5 mg total) by mouth daily as needed for allergies.    Dispense:  30 tablet    Refill:  5  . fluticasone (FLONASE) 50 MCG/ACT nasal spray    Sig: Place 2 sprays into both nostrils daily as needed for allergies or rhinitis.    Dispense:  16 g    Refill:  5    Diagnostics: Environmental skin testing: Positive to grass pollen, weed pollen, ragweed pollen, tree pollen, mold, cat hair, dog epithelia, dust mite, cockroach antigen. Food allergen skin testing: Positive to cashew, walnut, almond, hazelnut, crab, oyster, lobster, and scallops.    Physical examination: Blood pressure 104/74, pulse 76, temperature 98.2 F (36.8 C), temperature source Oral, resp. rate 16, height 5' 5.35" (1.66 m), weight 162 lb 4.1 oz (73.6 kg), SpO2 98 %,  unknown if currently breastfeeding.  General: Alert, interactive, in no acute distress. HEENT: TMs pearly gray, turbinates  edematous and pale with clear discharge, post-pharynx erythematous. Neck: Supple without lymphadenopathy. Lungs: Clear to auscultation without wheezing, rhonchi or rales. CV: Normal S1, S2 without murmurs. Abdomen: Nondistended, nontender. Skin: 1-11mm follicular non-erythematous papules on forehead. Extremities:  No clubbing, cyanosis or edema. Neuro:   Grossly intact.  Review of systems:  Review of systems negative except as noted in HPI / PMHx or noted below: Review of Systems  Constitutional: Negative.   HENT: Negative.   Eyes: Negative.   Respiratory: Negative.   Cardiovascular: Negative.   Gastrointestinal: Negative.   Genitourinary: Negative.   Musculoskeletal: Negative.   Skin: Negative.   Neurological: Negative.   Endo/Heme/Allergies: Negative.   Psychiatric/Behavioral: Negative.     Past medical history:  Past Medical History:  Diagnosis Date  . Depression    mild pp depression  . GERD (gastroesophageal reflux disease)   . Hx of varicella   . Palpitations     Past surgical history:  Past Surgical History:  Procedure Laterality Date  . SHOULDER BIOPSY  11/2007  . WISDOM TOOTH EXTRACTION      Family history: Family History  Problem Relation Age of Onset  . Hypertension Mother   . Thyroid disease Mother   . Osteoporosis Mother   . Allergic rhinitis Mother   . Food Allergy Mother   . Hypertension Maternal Grandmother   . Thyroid disease Maternal Grandmother   . Osteoporosis Maternal Grandmother   . Hypertension Maternal Grandfather   . Osteoporosis Paternal Grandmother   . Angioedema Neg Hx   . Asthma Neg Hx   . Eczema Neg Hx   . Immunodeficiency Neg Hx   . Urticaria Neg Hx     Social history: Social History   Social History  . Marital status: Married    Spouse name: N/A  . Number of children: N/A  . Years of education: N/A   Occupational History  . Not on file.   Social History Main Topics  . Smoking status: Never Smoker  . Smokeless  tobacco: Never Used  . Alcohol use No  . Drug use: No  . Sexual activity: Yes   Other Topics Concern  . Not on file   Social History Narrative  . No narrative on file   Environmental History: The patient lives in a house built in [place] with carpeting throughout, gas heat, and central air.  She is a nonsmoker without pets.    Medication List       Accurate as of 03/15/16  1:17 PM. Always use your most recent med list.          acetaminophen 500 MG tablet Commonly known as:  TYLENOL Take 1,000 mg by mouth every 6 (six) hours as needed for pain.   EPINEPHrine 0.3 mg/0.3 mL Soaj injection Commonly known as:  EPI-PEN Inject 0.3 mg into the muscle.   fluticasone 50 MCG/ACT nasal spray Commonly known as:  FLONASE Place 2 sprays into both nostrils daily as needed for allergies or rhinitis.   levocetirizine 5 MG tablet Commonly known as:  XYZAL Take 1 tablet (5 mg total) by mouth daily as needed for allergies.   prenatal multivitamin Tabs tablet Take 1 tablet by mouth daily at 12 noon.   TRINESSA (28) 0.18/0.215/0.25 MG-35 MCG tablet Generic drug:  Norgestimate-Ethinyl Estradiol Triphasic       Known medication allergies: Allergies  Allergen Reactions  . Aspirin  Anaphylaxis and Other (See Comments)    Lips and mouth swelling  . Codeine Swelling  . Motrin [Ibuprofen]   . Other     Nuts, mouth and lips swell, apples and cherries mouth swells  . Penicillins Swelling  . Shellfish Allergy   . Sulfonamide Derivatives Swelling    I appreciate the opportunity to take part in Catherine's care. Please do not hesitate to contact me with questions.  Sincerely,   R. Jorene Guest, MD

## 2016-03-15 NOTE — Assessment & Plan Note (Signed)
Quiescent over the past several years.  Should symptoms return, take levocetirizine 5 mg daily as needed and keep a detailed symptom/exposure journal.

## 2016-03-15 NOTE — Assessment & Plan Note (Signed)
The patient's history suggests shellfish and tree nut allergy and positive skin test results today confirm this diagnosis.  Meticulous avoidance of shellfish and tree nuts as discussed.  A prescription has been provided for epinephrine auto-injector 2 pack along with instructions for proper administration.  A food allergy action plan has been provided and discussed.  Medic Alert identification is recommended.

## 2018-06-09 DIAGNOSIS — J020 Streptococcal pharyngitis: Secondary | ICD-10-CM | POA: Diagnosis not present

## 2018-07-03 DIAGNOSIS — Z6828 Body mass index (BMI) 28.0-28.9, adult: Secondary | ICD-10-CM | POA: Diagnosis not present

## 2018-07-03 DIAGNOSIS — Z01419 Encounter for gynecological examination (general) (routine) without abnormal findings: Secondary | ICD-10-CM | POA: Diagnosis not present

## 2018-09-22 DIAGNOSIS — S93601A Unspecified sprain of right foot, initial encounter: Secondary | ICD-10-CM | POA: Diagnosis not present

## 2018-09-22 DIAGNOSIS — M79671 Pain in right foot: Secondary | ICD-10-CM | POA: Diagnosis not present

## 2018-11-21 DIAGNOSIS — Z0000 Encounter for general adult medical examination without abnormal findings: Secondary | ICD-10-CM | POA: Diagnosis not present

## 2018-11-21 DIAGNOSIS — Z79899 Other long term (current) drug therapy: Secondary | ICD-10-CM | POA: Diagnosis not present

## 2018-12-16 DIAGNOSIS — J3089 Other allergic rhinitis: Secondary | ICD-10-CM | POA: Diagnosis not present

## 2018-12-16 DIAGNOSIS — Z1159 Encounter for screening for other viral diseases: Secondary | ICD-10-CM | POA: Diagnosis not present

## 2018-12-16 DIAGNOSIS — Z0001 Encounter for general adult medical examination with abnormal findings: Secondary | ICD-10-CM | POA: Diagnosis not present

## 2018-12-16 DIAGNOSIS — T7800XA Anaphylactic reaction due to unspecified food, initial encounter: Secondary | ICD-10-CM | POA: Diagnosis not present

## 2019-01-02 ENCOUNTER — Encounter (HOSPITAL_COMMUNITY): Payer: Self-pay | Admitting: Emergency Medicine

## 2019-01-02 ENCOUNTER — Emergency Department (HOSPITAL_COMMUNITY): Payer: BC Managed Care – PPO

## 2019-01-02 ENCOUNTER — Emergency Department (HOSPITAL_COMMUNITY)
Admission: EM | Admit: 2019-01-02 | Discharge: 2019-01-02 | Disposition: A | Discharge status: Home / Self Care | Payer: BC Managed Care – PPO | Attending: Emergency Medicine | Admitting: Emergency Medicine

## 2019-01-02 ENCOUNTER — Other Ambulatory Visit: Payer: Self-pay

## 2019-01-02 DIAGNOSIS — Z20828 Contact with and (suspected) exposure to other viral communicable diseases: Secondary | ICD-10-CM | POA: Insufficient documentation

## 2019-01-02 DIAGNOSIS — R000 Tachycardia, unspecified: Secondary | ICD-10-CM | POA: Diagnosis not present

## 2019-01-02 DIAGNOSIS — I471 Supraventricular tachycardia: Secondary | ICD-10-CM | POA: Diagnosis not present

## 2019-01-02 DIAGNOSIS — R252 Cramp and spasm: Secondary | ICD-10-CM | POA: Diagnosis not present

## 2019-01-02 DIAGNOSIS — R0902 Hypoxemia: Secondary | ICD-10-CM | POA: Diagnosis not present

## 2019-01-02 LAB — CBC
HCT: 42.7 % (ref 36.0–46.0)
Hemoglobin: 14 g/dL (ref 12.0–15.0)
MCH: 32 pg (ref 26.0–34.0)
MCHC: 32.8 g/dL (ref 30.0–36.0)
MCV: 97.5 fL (ref 80.0–100.0)
Platelets: 251 10*3/uL (ref 150–400)
RBC: 4.38 MIL/uL (ref 3.87–5.11)
RDW: 12 % (ref 11.5–15.5)
WBC: 5.9 10*3/uL (ref 4.0–10.5)
nRBC: 0 % (ref 0.0–0.2)

## 2019-01-02 LAB — BASIC METABOLIC PANEL
Anion gap: 14 (ref 5–15)
BUN: 11 mg/dL (ref 6–20)
CO2: 21 mmol/L — ABNORMAL LOW (ref 22–32)
Calcium: 9.1 mg/dL (ref 8.9–10.3)
Chloride: 107 mmol/L (ref 98–111)
Creatinine, Ser: 0.85 mg/dL (ref 0.44–1.00)
GFR calc Af Amer: >60 mL/min (ref >60)
GFR calc non Af Amer: >60 mL/min (ref >60)
Glucose, Bld: 93 mg/dL (ref 70–99)
Potassium: 3.6 mmol/L (ref 3.5–5.1)
Sodium: 142 mmol/L (ref 135–145)

## 2019-01-02 LAB — I-STAT BETA HCG BLOOD, ED (MC, WL, AP ONLY): I-stat hCG, quantitative: <5 m[IU]/mL (ref <5)

## 2019-01-02 LAB — CK: Total CK: 133 U/L (ref 38–234)

## 2019-01-02 MED ORDER — SODIUM CHLORIDE 0.9% FLUSH
3.0000 mL | Freq: Once | INTRAVENOUS | Status: AC
  Administered 2019-01-02: 3 mL via INTRAVENOUS

## 2019-01-02 MED ORDER — SODIUM CHLORIDE 0.9 % IV BOLUS
1000.0000 mL | Freq: Once | INTRAVENOUS | Status: AC
  Administered 2019-01-02: 1000 mL via INTRAVENOUS

## 2019-01-02 NOTE — ED Provider Notes (Signed)
[facility] EMERGENCY DEPARTMENT Provider Note   CSN: [id number] Arrival date & time: 01/02/19  1309    History   Chief Complaint Chief Complaint  Patient presents with  . Tachycardia    HPI Dorothy Aguilar is a 38 y.o. female.     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 2 days ago played tennis, 92 degrees, and since then had diarrhea, felt "like a train ran over me", fatigue.    Today got back from the photo shoot and was trying to clean up the house then suddenly felt heart drop into a different gear, leaned over sink and felt it start. Lasted 10 min then called 911. Didn't get better.  No lightheadedness or syncope but felt it racing. Couldn't calm down and called EMS.  No CP or dyspnea.  EMS found HR 200s, gave adenosine and improvement. Tried to bear down but didn't help.  Never had something like this. 9 years ago was planning a pregnancy, told OB that she was having occasional heart racing and saw Dr. Verl Blalock at [facility] and they did an ECHO, EKG, no other monitoring. Reports in the past she had these episodes and they would get better with coughing.  Past Medical History:  Diagnosis Date  . Depression    mild pp depression  . GERD (gastroesophageal reflux disease)   . Hx of varicella   . Palpitations     Patient Active Problem List   Diagnosis Date Noted  . Food allergy 03/15/2016  . Perennial and seasonal allergic rhinitis 03/15/2016  . History of urticaria/angioedema 03/15/2016  . Keratosis pilaris 03/15/2016  . PALPITATIONS 09/01/2009  . HYPERLIPIDEMIA, FAMILIAL 08/31/2009  . TACHYCARDIA 08/30/2009  . RECTAL BLEEDING, HX OF 08/30/2009    Past Surgical History:  Procedure Laterality Date  . SHOULDER BIOPSY  11/2007  . WISDOM TOOTH EXTRACTION       OB History    Gravida  2   Para  2   Term  2   Preterm      AB      Living  2     SAB      TAB      Ectopic      Multiple      Live Births  2            Home Medications    Prior to Admission medications   Medication Sig Start Date End Date Taking? Authorizing Provider  acetaminophen (TYLENOL) 500 MG tablet Take 1,000 mg by mouth every 6 (six) hours as needed for pain.    [provider]  EPINEPHrine 0.3 mg/0.3 mL IJ SOAJ injection Inject 0.3 mg into the muscle. 02/17/16   [provider]  fluticasone (FLONASE) 50 MCG/ACT nasal spray Place 2 sprays into both nostrils daily as needed for allergies or rhinitis. 03/15/16   Bobbitt, Sedalia Muta, MD  levocetirizine (XYZAL) 5 MG tablet Take 1 tablet (5 mg total) by mouth daily as needed for allergies. 03/15/16   Bobbitt, Sedalia Muta, MD  Norgestimate-Ethinyl Estradiol Triphasic (TRINESSA, 28,) 0.18/0.215/0.25 MG-35 MCG tablet  01/05/14   [provider]  Prenatal Vit-Fe Fumarate-FA (PRENATAL MULTIVITAMIN) TABS Take 1 tablet by mouth daily at 12 noon.    [provider]    [facility]  History Family History  Problem Relation Age of Onset  . Hypertension Mother   . Thyroid disease Mother   . Osteoporosis Mother   . Allergic rhinitis Mother   . Food Allergy Mother   . Hypertension Maternal Grandmother   . Thyroid disease Maternal Grandmother   . Osteoporosis Maternal Grandmother   . Hypertension Maternal Grandfather   . Osteoporosis Paternal Grandmother   . Angioedema Neg Hx   . Asthma Neg Hx   . Eczema Neg Hx   . Immunodeficiency Neg Hx   . Urticaria Neg Hx     Social History Social History   Tobacco Use  . Smoking status: Never Smoker  . Smokeless tobacco: Never Used  Substance Use Topics  . Alcohol use: Yes  . Drug use: No     Allergies   Aspirin, Codeine, Motrin [ibuprofen], Other, Penicillins, Shellfish allergy, and Sulfonamide derivatives   Review of Systems Review of Systems   Constitutional: Negative for fever.  HENT: Negative for sore throat.   Eyes: Negative for visual disturbance.  Respiratory: Negative for cough and shortness of breath.   Cardiovascular: Positive for palpitations. Negative for chest pain.  Gastrointestinal: Negative for abdominal pain, diarrhea, nausea and vomiting.  Genitourinary: Negative for difficulty urinating.  Musculoskeletal: Negative for back pain and neck pain.  Skin: Negative for rash.  Neurological: Negative for syncope, light-headedness and headaches.     Physical Exam Updated Vital Signs BP 120/83 (BP Location: Right Arm)   Pulse 93   Temp 98.5 F (36.9 C) (Oral)   Resp 16   SpO2 100%   Physical Exam Vitals signs and nursing note reviewed.  Constitutional:      General: She is not in acute distress.    Appearance: She is well-developed. She is not diaphoretic.  HENT:     Head: Normocephalic and atraumatic.  Eyes:     Conjunctiva/sclera: Conjunctivae normal.  Neck:     Musculoskeletal: Normal range of motion.  Cardiovascular:     Rate and Rhythm: Regular rhythm. Tachycardia present.     Heart sounds: Normal heart sounds. No murmur. No friction rub. No gallop.   Pulmonary:     Effort: Pulmonary effort is normal. No respiratory distress.     Breath sounds: Normal breath sounds. No wheezing or rales.  Abdominal:     General: There is no distension.     Palpations: Abdomen is soft.     Tenderness: There is no abdominal tenderness. There is no guarding.  Musculoskeletal:        General: No tenderness.  Skin:    General: Skin is warm and dry.     Findings: No erythema or rash.  Neurological:     Mental Status: She is alert and oriented to person, place, and time.      ED Treatments / Results  Labs (all labs ordered are listed, but only abnormal results are displayed) Labs Reviewed  BASIC METABOLIC PANEL - Abnormal; Notable for the following components:      Result Value   CO2 21 (*)    All other  components within normal limits  NOVEL CORONAVIRUS, NAA (HOSPITAL ORDER, SEND-OUT TO REF LAB)  CBC  CK  I-STAT BETA HCG BLOOD, ED ([facility], [facility], [facility] ONLY)    EKG EKG Interpretation  Date/Time:  Friday January 02 2019 13:19:29 EDT Ventricular Rate:  102 PR Interval:    QRS Duration: 77 QT Interval:  331 QTC Calculation: 432 R Axis:   99 Text Interpretation:  Sinus tachycardia Borderline right axis deviation Low voltage, precordial leads Anteroseptal infarct, old Minimal ST depression, inferior leads No previous ECGs available Confirmed by Alvira MondaySchlossman, Jezlyn Westerfield ([id number]) on 01/02/2019 2:22:27 PM   Radiology Dg Chest 2 View  Result Date: 01/02/2019 CLINICAL DATA:  Supraventricular tachycardia EXAM: CHEST - 2 VIEW COMPARISON:  None. FINDINGS: The heart size and mediastinal contours are within normal limits. Both lungs are clear. The visualized skeletal structures are unremarkable. IMPRESSION: No active cardiopulmonary disease. Electronically Signed   By: Marlan Palauharles  Clark M.D.   On: 01/02/2019 14:09    Procedures Procedures (including critical care time)  Medications Ordered in ED Medications  sodium chloride flush (NS) 0.9 % injection 3 mL (3 mLs Intravenous Given 01/02/19 1454)  sodium chloride 0.9 % bolus 1,000 mL (0 mLs Intravenous Stopped 01/02/19 1559)     Initial Impression / Assessment and Plan / ED Course  I have reviewed the triage vital signs and the nursing notes.  Pertinent labs & imaging results that were available during my care of the patient were reviewed by me and considered in my medical decision making (see chart for details).        38yo female with history of prior palpitations presents with concern for palpitations, found to be SVT by EMS which resolved with adenosine.  Reviewed the strips from EMS showing HR in 200s. After receiving adenosine, returned to sinus rhythm and remains in sinus rhythm in the ED. No chest pain or dyspnea, doubt PE. By history suspect she has had  prior episodes but not been diagnosed.  Suspect episode brought on by dehydration related to recent work out in the heat, diarrhea, being out in sun today for 4 hours compounded by having only coffee today, and having etoh last night with dinner.  No significant electrolyte abnormalities. Given cramping pain, CK was checked and WNL.  Given IV fluids for hydration.  Discussed diagnosis of SVT, recommend avoiding caffeine, etoh, dehydration. Discussed vagal manuevers. Given diarrhea, will send COVID19 testing.  Referred to Cardiology for further care. Patient discharged in stable condition with understanding of reasons to return.   Final Clinical Impressions(s) / ED Diagnoses   Final diagnoses:  SVT (supraventricular tachycardia) [facility](HCC)    ED Discharge Orders    None       Alvira MondaySchlossman, Dannae Kato, MD 01/02/19 2046

## 2019-01-02 NOTE — ED Triage Notes (Signed)
Pt arrives from home with complaints of a fast heart rate. Pt  Endorses playing tennis in the heat and not drinking water X2days. Endorses legs cramps.   Pt denies prior history of SVT. EMS reports heart in the 210's upon arrival. EMS gave 6mg  IV adenosine with relief. Pt heart rate 100. 500 ml NS given en route.   138/80 HR 98 CGB 115 100% RA

## 2019-01-03 LAB — NOVEL CORONAVIRUS, NAA (HOSP ORDER, SEND-OUT TO REF LAB; TAT 18-24 HRS): SARS-CoV-2, NAA: NOT DETECTED

## 2019-03-20 DIAGNOSIS — S91332A Puncture wound without foreign body, left foot, initial encounter: Secondary | ICD-10-CM | POA: Diagnosis not present

## 2019-04-07 NOTE — Progress Notes (Signed)
Cardiology Office Note:    Date:  04/08/2019   ID:  Dorothy Rothmanaige S Mcbeth, DOB 01/03/1981, MRN [id number]  PCP:  Juanita Lasteripton, John S, MD  Cardiologist:  No primary care provider on file.  Electrophysiologist:  None   Referring MD: Juanita Lasteripton, John S, MD   Chief Complaint  Patient presents with   Tachycardia    History of Present Illness:    Dorothy Aguilar is a 38 y.o. female with a hx of SVT who is referred by Dr. Edmon Crapeipton for an evaluation of SVT.  She reports that she has had episodes of palpitations for years, where she feels like her heart is racing.  States that she will usually try coughing and episodes will resolve.  Typically last no more than 5 seconds.  However she presented to the ED on 01/02/2019 with a prolonged episode of palpitations.  She reports that she had started tennis lessons the day prior and had overexerted herself.  Also had been drinking wine that night.  Said she felt like she was dehydrated.  She tried coughing when the palpitations started, but had no resolution.  She called EMS, who checked her rhythm on arrival and found that she was in SVT with heart rate above 200.  They tried vagal maneuvers but did not resolve.  She was given IV adenosine with return to sinus rhythm.  She reports that since that episode in July she has had 1 more episode of SVT that occurred about a month ago.  She was dropping her kids off from school and noted that she had palpitations that lasted about 10 seconds, resolved with coughing.  She saw cardiologist in 2011 for SVT.  Had an echocardiogram which was unremarkable.  She she was drinking 4 cups of coffee each morning and another in the afternoon, but after this episode in July and she cut back to just 2 cups of coffee in the morning.  She reports that she drinks wine 5 nights per week, 2 glasses per night.  She reports no history of heart disease in her immediate family, but both her mother and grandmother had thyroid disease.  She denies any smoking  history.    Past Medical History:  Diagnosis Date   Depression    mild pp depression   GERD (gastroesophageal reflux disease)    Hx of varicella    Palpitations     Past Surgical History:  Procedure Laterality Date   SHOULDER BIOPSY  11/2007   WISDOM TOOTH EXTRACTION      Current Medications: Current Meds  Medication Sig   acetaminophen (TYLENOL) 500 MG tablet Take 1,000 mg by mouth every 6 (six) hours as needed for pain.   EPINEPHrine 0.3 mg/0.3 mL IJ SOAJ injection Inject 0.3 mg into the muscle.   fluticasone (FLONASE) 50 MCG/ACT nasal spray Place 2 sprays into both nostrils daily as needed for allergies or rhinitis.   levocetirizine (XYZAL) 5 MG tablet Take 1 tablet (5 mg total) by mouth daily as needed for allergies.   Multiple Vitamin (MULTIVITAMIN) tablet Take 1 tablet by mouth daily.   Norgestimate-Ethinyl Estradiol Triphasic (TRINESSA, 28,) 0.18/0.215/0.25 MG-35 MCG tablet      Allergies:   Aspirin, Codeine, Motrin [ibuprofen], Other, Penicillins, Shellfish allergy, and Sulfonamide derivatives   Social History   Socioeconomic History   Marital status: Married    Spouse name: Not on file   Number of children: Not on file   Years of education: Not on file   Highest education  level: Not on file  Occupational History   Not on file  Social Needs   Financial resource strain: Not on file   Food insecurity    Worry: Not on file    Inability: Not on file   Transportation needs    Medical: Not on file    Non-medical: Not on file  Tobacco Use   Smoking status: Never Smoker   Smokeless tobacco: Never Used  Substance and Sexual Activity   Alcohol use: Yes   Drug use: No   Sexual activity: Yes  Lifestyle   Physical activity    Days per week: Not on file    Minutes per session: Not on file   Stress: Not on file  Relationships   Social connections    Talks on phone: Not on file    Gets together: Not on file    Attends religious  service: Not on file    Active member of club or organization: Not on file    Attends meetings of clubs or organizations: Not on file    Relationship status: Not on file  Other Topics Concern   Not on file  Social History Narrative   Not on file     Family History: The patient's family history includes Allergic rhinitis in her mother; Food Allergy in her mother; Hypertension in her maternal grandfather, maternal grandmother, and mother; Osteoporosis in her maternal grandmother, mother, and paternal grandmother; Thyroid disease in her maternal grandmother and mother. There is no history of Angioedema, Asthma, Eczema, Immunodeficiency, or Urticaria.  ROS:   Please see the history of present illness.     All other systems reviewed and are negative.  EKGs/Labs/Other Studies Reviewed:    The following studies were reviewed today:   EKG:  EKG is  ordered today.  The ekg ordered today demonstrates normal sinus rhythm, rate 79, no ST/T abnormalities  EKG from EMS run sheet 01/02/19: SVT, rate 200    Recent Labs: 01/02/2019: BUN 11; Creatinine, Ser 0.85; Hemoglobin 14.0; Platelets 251; Potassium 3.6; Sodium 142  Recent Lipid Panel No results found for: CHOL, TRIG, HDL, CHOLHDL, VLDL, LDLCALC, LDLDIRECT  Physical Exam:    VS:  BP 110/76    Pulse 79    Ht 5\' 6"  (1.676 m)    Wt 196 lb (88.9 kg)    BMI 31.64 kg/m     Wt Readings from Last 3 Encounters:  04/08/19 196 lb (88.9 kg)  03/15/16 162 lb 4.1 oz (73.6 kg)  08/13/12 225 lb (102.1 kg)     GEN:  Well nourished, well developed in no acute distress HEENT: Normal NECK: No JVD LYMPHATICS: No lymphadenopathy CARDIAC: RRR, no murmurs, rubs, gallops RESPIRATORY:  Clear to auscultation without rales, wheezing or rhonchi  ABDOMEN: Soft, non-tender, non-distended MUSCULOSKELETAL:  No edema; No deformity  SKIN: Warm and dry NEUROLOGIC:  Alert and oriented x 3 PSYCHIATRIC:  Normal affect   ASSESSMENT:    1. SVT (supraventricular  tachycardia) ([place])   2. TACHYCARDIA   3. Palpitations    PLAN:    In order of problems listed above:  SVT: Infrequent episodes, but episode in July required adenosine administration by EMS to break.  Discussed treatment options with patient, including just monitoring and avoiding triggers, starting medication for suppression, or considering ablation.  Given the infrequent episodes, she would prefer to just monitor for now and avoid triggers.  Recommended decreasing caffeine and alcohol intake.  If occurring more frequently, can plan to either start beta-blocker  for suppression or EP referral for ablation.  Will check TSH.  Will check 30-day monitor to evaluate frequency of episodes.  RTC in 3 months   Medication Adjustments/Labs and Tests Ordered: Current medicines are reviewed at length with the patient today.  Concerns regarding medicines are outlined above.  Orders Placed This Encounter  Procedures   TSH   Cardiac event monitor   EKG 12-Lead   No orders of the defined types were placed in this encounter.   Patient Instructions  Medication Instructions:   Continue same medications   Lab Work:  TSH today   Testing/Procedures:  Schedule 30 day event monitor  Follow-Up: At [facility], you and your health needs are our priority.  As part of our continuing mission to provide you with exceptional heart care, we have created designated Provider Care Teams.  These Care Teams include your primary Cardiologist (physician) and Advanced Practice Providers (APPs -  Physician Assistants and Nurse Practitioners) who all work together to provide you with the care you need, when you need it.  Your next appointment:  3 months   The format for your next appointment:  Office   Provider:  Dr.Livianna Petraglia        Signed, Little Ishikawa, MD  04/08/2019 12:57 PM    [facility]

## 2019-04-08 ENCOUNTER — Encounter: Payer: Self-pay | Admitting: Cardiology

## 2019-04-08 ENCOUNTER — Ambulatory Visit: Payer: BC Managed Care – PPO | Admitting: Cardiology

## 2019-04-08 ENCOUNTER — Other Ambulatory Visit: Payer: Self-pay

## 2019-04-08 VITALS — BP 110/76 | HR 79 | Ht 66.0 in | Wt 196.0 lb

## 2019-04-08 DIAGNOSIS — R002 Palpitations: Secondary | ICD-10-CM

## 2019-04-08 DIAGNOSIS — I471 Supraventricular tachycardia: Secondary | ICD-10-CM

## 2019-04-08 DIAGNOSIS — R000 Tachycardia, unspecified: Secondary | ICD-10-CM

## 2019-04-08 LAB — TSH: TSH: 3.03 u[IU]/mL (ref 0.450–4.500)

## 2019-04-08 NOTE — Patient Instructions (Signed)
Medication Instructions:   Continue same medications   Lab Work:  TSH today   Testing/Procedures:  Schedule 30 day event monitor  Follow-Up: At [facility], you and your health needs are our priority.  As part of our continuing mission to provide you with exceptional heart care, we have created designated Provider Care Teams.  These Care Teams include your primary Cardiologist (physician) and Advanced Practice Providers (APPs -  Physician Assistants and Nurse Practitioners) who all work together to provide you with the care you need, when you need it.  Your next appointment:  3 months   The format for your next appointment:  Office   Provider:  Dr.Schumann

## 2019-04-15 ENCOUNTER — Telehealth: Payer: Self-pay | Admitting: *Deleted

## 2019-04-15 NOTE — Telephone Encounter (Signed)
[employer] to ship a 30 day cardiac event monitor to the patients home.  Instructions included in the monitor kit.  Patient instructed to call [employer] after applying monitor to confirm monitor is transmitting and send baseline recording.

## 2019-04-29 ENCOUNTER — Ambulatory Visit (INDEPENDENT_AMBULATORY_CARE_PROVIDER_SITE_OTHER): Payer: BC Managed Care – PPO

## 2019-04-29 DIAGNOSIS — R000 Tachycardia, unspecified: Secondary | ICD-10-CM

## 2019-04-29 DIAGNOSIS — R002 Palpitations: Secondary | ICD-10-CM

## 2019-05-11 DIAGNOSIS — Z20828 Contact with and (suspected) exposure to other viral communicable diseases: Secondary | ICD-10-CM | POA: Diagnosis not present

## 2019-05-22 ENCOUNTER — Telehealth: Payer: Self-pay | Admitting: Cardiology

## 2019-05-22 NOTE — Telephone Encounter (Signed)
New Message    Pt is calling and asks for Dr Gardiner Rhyme to call her back. She says she has bee in SVT for about 3 hours now and she cannot get out of it  Pt feels tired and a little dizzy  She says she feels a little scared   Please call

## 2019-05-22 NOTE — Telephone Encounter (Signed)
Patients HR is now 108, she feels better.States she had wine last night. I encouraged her to avoid, caffeine, stimulants and alcohol.She is still wearing her event monitor

## 2019-07-15 ENCOUNTER — Encounter: Payer: Self-pay | Admitting: Obstetrics and Gynecology

## 2019-07-15 DIAGNOSIS — Z131 Encounter for screening for diabetes mellitus: Secondary | ICD-10-CM | POA: Diagnosis not present

## 2019-07-15 DIAGNOSIS — G4700 Insomnia, unspecified: Secondary | ICD-10-CM | POA: Diagnosis not present

## 2019-07-15 DIAGNOSIS — Z13228 Encounter for screening for other metabolic disorders: Secondary | ICD-10-CM | POA: Diagnosis not present

## 2019-07-15 DIAGNOSIS — Z1329 Encounter for screening for other suspected endocrine disorder: Secondary | ICD-10-CM | POA: Diagnosis not present

## 2019-07-15 DIAGNOSIS — Z01419 Encounter for gynecological examination (general) (routine) without abnormal findings: Secondary | ICD-10-CM | POA: Diagnosis not present

## 2019-07-15 DIAGNOSIS — R635 Abnormal weight gain: Secondary | ICD-10-CM | POA: Diagnosis not present

## 2019-07-15 DIAGNOSIS — Z1321 Encounter for screening for nutritional disorder: Secondary | ICD-10-CM | POA: Diagnosis not present

## 2019-07-15 DIAGNOSIS — Z6832 Body mass index (BMI) 32.0-32.9, adult: Secondary | ICD-10-CM | POA: Diagnosis not present

## 2019-07-30 ENCOUNTER — Institutional Professional Consult (permissible substitution): Payer: BC Managed Care – PPO | Admitting: Pulmonary Disease

## 2019-07-30 DIAGNOSIS — R799 Abnormal finding of blood chemistry, unspecified: Secondary | ICD-10-CM | POA: Diagnosis not present

## 2019-07-30 DIAGNOSIS — E039 Hypothyroidism, unspecified: Secondary | ICD-10-CM | POA: Diagnosis not present

## 2019-07-30 DIAGNOSIS — R635 Abnormal weight gain: Secondary | ICD-10-CM | POA: Diagnosis not present

## 2019-08-02 NOTE — Progress Notes (Signed)
Cardiology Office Note:    Date:  08/07/2019   ID:  Dorothy Aguilar, DOB 1980/12/04, MRN [id number]  PCP:  Juanita Laster, MD  Cardiologist:  No primary care provider on file.  Electrophysiologist:  None   Referring MD: Juanita Laster, MD   No chief complaint on file.   History of Present Illness:    Dorothy Aguilar is a 39 y.o. female with a hx of SVT who presents for follow-up.   She reports that she has had episodes of palpitations for years, where she feels like her heart is racing.  States that she will usually try coughing and episodes will resolve.  Typically last no more than 5 seconds.  However she presented to the ED on 01/02/2019 with a prolonged episode of palpitations.  She reports that she had started tennis lessons the day prior and had overexerted herself.  Also had been drinking wine that night.  Said she felt like she was dehydrated.  She tried coughing when the palpitations started, but had no resolution.  She called EMS, who checked her rhythm on arrival and found that she was in SVT with heart rate above 200.  They tried vagal maneuvers but did not resolve.  She was given IV adenosine with return to sinus rhythm.  She reports that since that episode in July she has had 1 more episode of SVT that occurred about a month ago.  She was dropping her kids off from school and noted that she had palpitations that lasted about 10 seconds, resolved with coughing.  She saw cardiologist in 2011 for SVT.  Had an echocardiogram which was unremarkable.  She she was drinking 4 cups of coffee each morning and another in the afternoon, but after this episode in July and she cut back to just 2 cups of coffee in the morning.  She reports that she drinks wine 5 nights per week, 2 glasses per night.  She reports no history of heart disease in her immediate family, but both her mother and grandmother had thyroid disease.  She denies any smoking history.  Cardiac monitor on 06/03/2019 showed episode of  SVT with heart rate 150 on 12/18.  Drank 3 glasses of red wine that night.  Woke up in middle of night with symptoms.  Felt better at 10AM the next morning.  No episodes since.  No lightheadedness.  Stopped drinking wine in December.  Has vodka-tonic 4 times per week.  Decreased coffee to 1/2 cup daily.    Past Medical History:  Diagnosis Date  . Depression    mild pp depression  . GERD (gastroesophageal reflux disease)   . Hx of varicella   . Palpitations     Past Surgical History:  Procedure Laterality Date  . SHOULDER BIOPSY  11/2007  . WISDOM TOOTH EXTRACTION      Current Medications: Current Meds  Medication Sig  . fexofenadine (ALLEGRA) 180 MG tablet Take by mouth.  . fluticasone (FLONASE) 50 MCG/ACT nasal spray Place into the nose.     Allergies:   Aspirin, Codeine, Motrin [ibuprofen], Other, Penicillins, Shellfish allergy, and Sulfonamide derivatives   Social History   Socioeconomic History  . Marital status: Married    Spouse name: Not on file  . Number of children: Not on file  . Years of education: Not on file  . Highest education level: Not on file  Occupational History  . Not on file  Tobacco Use  . Smoking status: Never Smoker  .  Smokeless tobacco: Never Used  Substance and Sexual Activity  . Alcohol use: Yes  . Drug use: No  . Sexual activity: Yes  Other Topics Concern  . Not on file  Social History Narrative  . Not on file   Social Determinants of Health   Financial Resource Strain:   . Difficulty of Paying Living Expenses: Not on file  Food Insecurity:   . Worried About Programme researcher, broadcasting/film/video in the Last Year: Not on file  . Ran Out of Food in the Last Year: Not on file  Transportation Needs:   . Lack of Transportation (Medical): Not on file  . Lack of Transportation (Non-Medical): Not on file  Physical Activity:   . Days of Exercise per Week: Not on file  . Minutes of Exercise per Session: Not on file  Stress:   . Feeling of Stress : Not  on file  Social Connections:   . Frequency of Communication with Friends and Family: Not on file  . Frequency of Social Gatherings with Friends and Family: Not on file  . Attends Religious Services: Not on file  . Active Member of Clubs or Organizations: Not on file  . Attends Banker Meetings: Not on file  . Marital Status: Not on file     Family History: The patient's family history includes Allergic rhinitis in her mother; Food Allergy in her mother; Hypertension in her maternal grandfather, maternal grandmother, and mother; Osteoporosis in her maternal grandmother, mother, and paternal grandmother; Thyroid disease in her maternal grandmother and mother. There is no history of Angioedema, Asthma, Eczema, Immunodeficiency, or Urticaria.  ROS:   Please see the history of present illness.     All other systems reviewed and are negative.  EKGs/Labs/Other Studies Reviewed:    The following studies were reviewed today:   EKG:  EKG is not ordered today.  The last ekg ordered demonstrates normal sinus rhythm, rate 79, no ST/T abnormalities  EKG from EMS run sheet 01/02/19: SVT, rate 200    Recent Labs: 01/02/2019: BUN 11; Creatinine, Ser 0.85; Hemoglobin 14.0; Platelets 251; Potassium 3.6; Sodium 142 04/08/2019: TSH 3.030  Recent Lipid Panel No results found for: CHOL, TRIG, HDL, CHOLHDL, VLDL, LDLCALC, LDLDIRECT   Cardiac monitor 06/03/19:  Episode of likely SVT with HR 150 bpm at 2AM on 12/18; there is significant artifact during episode. Unclear duration, next recording at 5AM appears to show sinus tachycardia.   Predominant rhythm is sinus rhythm. Range is 60 to 166 bpm with average of 83 bpm. No atrial fibrillation, sustained ventricular tachycardia, significant pause, or high degree AV block. Total ectopy <1%. 3 patient triggered events, which corresponded to sinus rhythm.  Episode of likely SVT with HR 150 bpm at 2AM on 12/18; there is significant artifact during  this episode.  Unclear duration, next recording at 5AM appears to show sinus tachycardia.  Physical Exam:    VS:  BP 116/78   Pulse 79   Temp 97.7 F (36.5 C)   Ht 5\' 6"  (1.676 m)   Wt 196 lb (88.9 kg)   SpO2 98%   BMI 31.64 kg/m     Wt Readings from Last 3 Encounters:  08/06/19 196 lb (88.9 kg)  04/08/19 196 lb (88.9 kg)  03/15/16 162 lb 4.1 oz (73.6 kg)     GEN:  Well nourished, well developed in no acute distress HEENT: Normal NECK: No JVD LYMPHATICS: No lymphadenopathy CARDIAC: RRR, no murmurs, rubs, gallops RESPIRATORY:  Clear to  auscultation without rales, wheezing or rhonchi  ABDOMEN: Soft, non-tender, non-distended MUSCULOSKELETAL:  No edema; No deformity  SKIN: Warm and dry NEUROLOGIC:  Alert and oriented x 3 PSYCHIATRIC:  Normal affect   ASSESSMENT:    1. SVT (supraventricular tachycardia) (HCC)    PLAN:    In order of problems listed above:  SVT: Infrequent episodes, but episode in July 2020 required adenosine administration by EMS to break.  Cardiac monitor showed another episode in December 2020 that occurred after drinking 3 glasses of wine.  Patient has noted that alcohol seems to be a significant trigger, and has stopped drinking wine.  She has not had any episodes since December.  Discussed treatment options, including monitoring and trigger avoidance versus starting beta-blocker for suppression versus ablation.  She would prefer to just monitor for now and avoid triggers.  Recommended limiting alcohol intake.  RTC in 1 year   Medication Adjustments/Labs and Tests Ordered: Current medicines are reviewed at length with the patient today.  Concerns regarding medicines are outlined above.  No orders of the defined types were placed in this encounter.  No orders of the defined types were placed in this encounter.   Patient Instructions  Medication Instructions:  Your physician recommends that you continue on your current medications as directed.  Please refer to the Current Medication list given to you today.  *If you need a refill on your cardiac medications before your next appointment, please call your pharmacy*   Lab Work: NONE  Testing/Procedures: NONE  Follow-Up: At [facility], you and your health needs are our priority.  As part of our continuing mission to provide you with exceptional heart care, we have created designated Provider Care Teams.  These Care Teams include your primary Cardiologist (physician) and Advanced Practice Providers (APPs -  Physician Assistants and Nurse Practitioners) who all work together to provide you with the care you need, when you need it.  We recommend signing up for the patient portal called "MyChart".  Sign up information is provided on this After Visit Summary.  MyChart is used to connect with patients for Virtual Visits (Telemedicine).  Patients are able to view lab/test results, encounter notes, upcoming appointments, etc.  Non-urgent messages can be sent to your provider as well.   To learn more about what you can do with MyChart, go to [email or web address].    Your next appointment:   12 month(s)  The format for your next appointment:   In Person  Provider:   Oswaldo Milian, MD        Signed, Donato Heinz, MD  08/07/2019 1:37 AM    [place]

## 2019-08-06 ENCOUNTER — Other Ambulatory Visit: Payer: Self-pay

## 2019-08-06 ENCOUNTER — Encounter: Payer: Self-pay | Admitting: Cardiology

## 2019-08-06 ENCOUNTER — Ambulatory Visit: Payer: BC Managed Care – PPO | Admitting: Cardiology

## 2019-08-06 VITALS — BP 116/78 | HR 79 | Temp 97.7°F | Ht 66.0 in | Wt 196.0 lb

## 2019-08-06 DIAGNOSIS — I471 Supraventricular tachycardia: Secondary | ICD-10-CM | POA: Diagnosis not present

## 2019-08-06 NOTE — Patient Instructions (Signed)
Medication Instructions:  Your physician recommends that you continue on your current medications as directed. Please refer to the Current Medication list given to you today.  *If you need a refill on your cardiac medications before your next appointment, please call your pharmacy*   Lab Work: NONE  Testing/Procedures: NONE  Follow-Up: At [facility], you and your health needs are our priority.  As part of our continuing mission to provide you with exceptional heart care, we have created designated Provider Care Teams.  These Care Teams include your primary Cardiologist (physician) and Advanced Practice Providers (APPs -  Physician Assistants and Nurse Practitioners) who all work together to provide you with the care you need, when you need it.  We recommend signing up for the patient portal called "MyChart".  Sign up information is provided on this After Visit Summary.  MyChart is used to connect with patients for Virtual Visits (Telemedicine).  Patients are able to view lab/test results, encounter notes, upcoming appointments, etc.  Non-urgent messages can be sent to your provider as well.   To learn more about what you can do with MyChart, go to [email or web address].    Your next appointment:   12 month(s)  The format for your next appointment:   In Person  Provider:   Christopher Schumann, MD      

## 2019-08-11 ENCOUNTER — Institutional Professional Consult (permissible substitution): Payer: BC Managed Care – PPO | Admitting: Pulmonary Disease

## 2019-08-18 DIAGNOSIS — Z30430 Encounter for insertion of intrauterine contraceptive device: Secondary | ICD-10-CM | POA: Diagnosis not present

## 2019-08-20 ENCOUNTER — Ambulatory Visit: Payer: BC Managed Care – PPO | Admitting: Pulmonary Disease

## 2019-08-20 ENCOUNTER — Other Ambulatory Visit: Payer: Self-pay

## 2019-08-20 ENCOUNTER — Encounter: Payer: Self-pay | Admitting: Pulmonary Disease

## 2019-08-20 VITALS — BP 122/78 | HR 110 | Temp 97.1°F | Ht 66.0 in | Wt 198.8 lb

## 2019-08-20 DIAGNOSIS — G4700 Insomnia, unspecified: Secondary | ICD-10-CM

## 2019-08-20 NOTE — Patient Instructions (Signed)
Sleep maintenance insomnia Snoring  Need to rule out sleep disordered breathing-we will obtain a sleep study  Behavioral changes -No heavy meals late into the night -Limit liquid ingestion close to bedtime -Limit time in bed to about 6 to 8 hours -Avoid getting active when you wake up in the middle of the night  -Graded exercises  -Once we have a sleep study that showing no significant sleep disordered breathing, with behavioral modifications, if there is still significant sleep maintenance issues-a mild sleep aid may help  We will see you in about 3 months

## 2019-08-20 NOTE — Progress Notes (Signed)
Dorothy Aguilar    [id number]    1980/07/27  Primary Care Physician:Tipton, Gwynn Burly, MD  Referring Physician: Tamala Julian, MD [address] [place],  [place] [id number]  Chief complaint:   Patient with history of insomnia, sleep maintenance insomnia  HPI:  She would usually wake up about 3 hours after falling asleep, heart racing Has had 2 episodes of SVT July 2020-ended up been evaluated in the hospital December 2020-while she had a monitor: Follows up with cardiology Not on any medications at present  Has gained about 30 pounds recently since Covid restrictions Has been told that she snores only when tired No witnessed apneas  Usually goes to bed at 930 to 1030 PM Falls asleep soon after 1-2 awakenings Final wake up time about 6 AM  When she wakes up after 3 hours in bed She has gotten into the habit of taking a shower,, losartan and then trying to go back to sleep Wakes up like she is at a good nights rest on most days Will start feeling tired again after about 3 to 4 hours Occasionally may take a nap  She does eat a late supper on most nights  Works full-time Never smoker   Outpatient Encounter Medications as of 08/20/2019  Medication Sig  . acetaminophen (TYLENOL) 500 MG tablet Take 1,000 mg by mouth every 6 (six) hours as needed for pain.  . fexofenadine (ALLEGRA) 180 MG tablet Take by mouth.  . fluticasone (FLONASE) 50 MCG/ACT nasal spray Place into the nose.  . levocetirizine (XYZAL) 5 MG tablet Take 1 tablet (5 mg total) by mouth daily as needed for allergies.  . Multiple Vitamin (MULTIVITAMIN) tablet Take 1 tablet by mouth daily.  . Norgestimate-Ethinyl Estradiol Triphasic 0.18/0.215/0.25 MG-25 MCG tab Take by mouth.   No facility-administered encounter medications on file as of 08/20/2019.    Allergies as of 08/20/2019 - Review Complete 08/06/2019  Allergen Reaction Noted  . Aspirin Anaphylaxis and Other (See Comments)   .  Codeine Swelling   . Motrin [ibuprofen]  08/05/2012  . Other  08/05/2012  . Penicillins Swelling   . Shellfish allergy  08/05/2012  . Sulfonamide derivatives Swelling     Past Medical History:  Diagnosis Date  . Depression    mild pp depression  . GERD (gastroesophageal reflux disease)   . Hx of varicella   . Palpitations     Past Surgical History:  Procedure Laterality Date  . SHOULDER BIOPSY  11/2007  . WISDOM TOOTH EXTRACTION      Family History  Problem Relation Age of Onset  . Hypertension Mother   . Thyroid disease Mother   . Osteoporosis Mother   . Allergic rhinitis Mother   . Food Allergy Mother   . Hypertension Maternal Grandmother   . Thyroid disease Maternal Grandmother   . Osteoporosis Maternal Grandmother   . Hypertension Maternal Grandfather   . Osteoporosis Paternal Grandmother   . Angioedema Neg Hx   . Asthma Neg Hx   . Eczema Neg Hx   . Immunodeficiency Neg Hx   . Urticaria Neg Hx     Social History   Socioeconomic History  . Marital status: Married    Spouse name: Not on file  . Number of children: Not on file  . Years of education: Not on file  . Highest education level: Not on file  Occupational History  . Not on file  Tobacco Use  .  Smoking status: Never Smoker  . Smokeless tobacco: Never Used  Substance and Sexual Activity  . Alcohol use: Yes  . Drug use: No  . Sexual activity: Yes  Other Topics Concern  . Not on file  Social History Narrative  . Not on file   Social Determinants of Health   Financial Resource Strain:   . Difficulty of Paying Living Expenses:   Food Insecurity:   . Worried About Programme researcher, broadcasting/film/video in the Last Year:   . Barista in the Last Year:   Transportation Needs:   . Freight forwarder (Medical):   Marland Kitchen Lack of Transportation (Non-Medical):   Physical Activity:   . Days of Exercise per Week:   . Minutes of Exercise per Session:   Stress:   . Feeling of Stress :   Social Connections:     . Frequency of Communication with Friends and Family:   . Frequency of Social Gatherings with Friends and Family:   . Attends Religious Services:   . Active Member of Clubs or Organizations:   . Attends Banker Meetings:   Marland Kitchen Marital Status:   Intimate Partner Violence:   . Fear of Current or Ex-Partner:   . Emotionally Abused:   Marland Kitchen Physically Abused:   . Sexually Abused:     Review of Systems  Psychiatric/Behavioral: Positive for sleep disturbance.    Vitals:   08/20/19 1532  BP: 122/78  Pulse: (!) 110  Temp: (!) 97.1 F (36.2 C)  SpO2: 99%     Physical Exam  Constitutional: She appears well-developed. No distress.  HENT:  Head: Normocephalic.  Mallampati 1  Eyes: Pupils are equal, round, and reactive to light. Right eye exhibits no discharge.  Neck: No tracheal deviation present. No thyromegaly present.  Cardiovascular: Normal rate and regular rhythm.  Pulmonary/Chest: Effort normal. No respiratory distress. She has no wheezes.  Musculoskeletal:        General: Normal range of motion.     Cervical back: Normal range of motion and neck supple.  Neurological: She is alert.  Skin: Skin is warm and dry. No erythema.  Psychiatric: She has a normal mood and affect.   Results of the Epworth flowsheet 08/20/2019  Sitting and reading 0  Watching TV 0  Sitting, inactive in a public place (e.g. a theatre or a meeting) 0  As a passenger in a car for an hour without a break 1  Lying down to rest in the afternoon when circumstances permit 3  Sitting and talking to someone 0  Sitting quietly after a lunch without alcohol 0  In a car, while stopped for a few minutes in traffic 0  Total score 4    Assessment:  Sleep maintenance insomnia  History of snoring, daytime fatigue, wakes up with palpitations  History of SVT  Plan/Recommendations: We will obtain a home sleep study  Sleep hygiene  Modification of behaviors prior to and during nighttime -Restrict  bedtime hours -No late supper -No large amount of beverage/fluids prior to bedtime -Allow at least 4 hours prior to bedtime for the last meal -Avoid significant activity whenever she wakes up in the middle of the night  I will see her back in the office in about 3 months  We will update her with sleep study findings, if no significant sleep disordered breathing and she is still having significant insomnia despite behavioral changes, a mild hypnotic may help   Virl Diamond MD [facility] 08/20/2019, 3:56 PM  CC: Juanita Laster, MD

## 2019-09-02 ENCOUNTER — Ambulatory Visit: Payer: BC Managed Care – PPO | Admitting: Registered"

## 2019-10-09 ENCOUNTER — Other Ambulatory Visit: Payer: Self-pay

## 2019-10-09 ENCOUNTER — Ambulatory Visit: Payer: BC Managed Care – PPO

## 2019-10-09 DIAGNOSIS — G4700 Insomnia, unspecified: Secondary | ICD-10-CM

## 2019-10-09 DIAGNOSIS — R0683 Snoring: Secondary | ICD-10-CM | POA: Diagnosis not present

## 2019-10-13 DIAGNOSIS — B081 Molluscum contagiosum: Secondary | ICD-10-CM | POA: Diagnosis not present

## 2019-10-13 DIAGNOSIS — D239 Other benign neoplasm of skin, unspecified: Secondary | ICD-10-CM | POA: Diagnosis not present

## 2019-10-13 DIAGNOSIS — L821 Other seborrheic keratosis: Secondary | ICD-10-CM | POA: Diagnosis not present

## 2019-10-13 DIAGNOSIS — D229 Melanocytic nevi, unspecified: Secondary | ICD-10-CM | POA: Diagnosis not present

## 2019-10-13 DIAGNOSIS — L718 Other rosacea: Secondary | ICD-10-CM | POA: Diagnosis not present

## 2019-10-16 ENCOUNTER — Telehealth: Payer: Self-pay | Admitting: *Deleted

## 2019-10-16 ENCOUNTER — Telehealth: Payer: Self-pay | Admitting: Pulmonary Disease

## 2019-10-16 DIAGNOSIS — R0683 Snoring: Secondary | ICD-10-CM | POA: Diagnosis not present

## 2019-10-16 NOTE — Telephone Encounter (Signed)
Call patient  Sleep study result  Date of study: 10/12/2019   Impression: Negative for significant obstructive sleep apnea Mild oxygen desaturations Study may have been limited by long periods of limited flow data  Recommendation: Efforts at management of known insomnia Weight loss measures encouraged Caution against driving when sleeping against medications with sedating side effects

## 2019-10-19 NOTE — Telephone Encounter (Signed)
Dr. Wynona Neat has reviewed the home sleep test this test was negative for sleep apnea. Long periods of limited flow data. Mild oxygen desaturations.  Dr . Wynona Neat recommendation are:   Efforts at management of patients known insomnia.  Regular exercises will help promote good quality sleep.  Optimize sleep hygiene and obtain at least 6-7 hours of sleep.  Weight loss measures encouraged.  She should be cautioned against driving when sleepy and against medications with sedative side effects.   Patient verbalized understanding of results.

## 2019-10-20 ENCOUNTER — Other Ambulatory Visit: Payer: Self-pay

## 2019-10-20 ENCOUNTER — Encounter: Payer: BC Managed Care – PPO | Attending: Obstetrics and Gynecology | Admitting: Registered"

## 2019-10-20 ENCOUNTER — Encounter: Payer: Self-pay | Admitting: Registered"

## 2019-10-20 DIAGNOSIS — R635 Abnormal weight gain: Secondary | ICD-10-CM | POA: Diagnosis not present

## 2019-10-20 NOTE — Patient Instructions (Signed)
-   Aim to increase joyful movement to at least 20 min, 2 days/week.  - Aim to have afternoon snack around 2-3 pm   - Aim to have vegetables when having pizza.   - Aim to increase water intake. Grab a bottle of water when leaving home to drink while driving.

## 2019-10-20 NOTE — Progress Notes (Signed)
Medical Nutrition Therapy:  Appt start time: 10:22 end time:  11:14.   Assessment:  Primary concerns today: Pt arrives stating she needs .   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 States she has struggled since age 39 related to eating, exercise, etc. 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 States she has been intensely training since age 5. States she used to carb load; began at age 50. States she can accept a plan from someone else. States she has My[employer]itness[employer].   States she has gained 30 lbs since last March (2020). Reports she has 2 sons (ages 89 and 26). States they were home and she worked from home. States she ate when sons ate, when husband ate, etc. Reports she started drinking one beer/night; states she likes it.   States she typically wakes up at 6 am to prepare for the day. 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 Reports things have been more idle during pandemic whereas she was traveling a lot prior to.   States she eats things when she's bored, will eat beyond being full. States she thinks someone may eat it, it will go bad, or something else will happen to it.   States this year was diagnosed with SVT (12/2018). Cardiac episode. RHR was 215. Had another event in 05/2019. RHR was 155 for 5 hrs. States she was possibly eating too close to bedtime and that could be increasing HR at night.    States she is scared to run due to previous heart episodes. Cardiologist has cleared her to briskly walk and run. States she has also been scared about her food.   Started doing keto last summer for 2 months. Lost 6 lbs and gained 12-15 lbs afterwards. Reports she fears getting out of control. States husband is currently losing weight with [employer] program   Preferred Learning Style:   No preference indicated   Learning Readiness:   Not ready  Contemplating  Ready  Change in  progress   MEDICATIONS: See list   DIETARY INTAKE:  Usual eating pattern includes 3 meals and 2 snacks per day.  Everyday foods include eggs, bacon, fruit, sandwich, beer, vegetables.  Avoided foods include sweets, too much caffeine, alcohol, and herbal supplements.    24-hr recall:  B ( AM): 2 c coffee (with unsweetened creamer)  Snk (8:30 AM): scrambled eggs + bacon + berries  L (11 AM): Zoe's-pita + hummus + salad with vinaigrette dressing + water Snk (4 PM): [place] sandwich or McDonald's-cheeseburger D (9 PM): 3-4 chicken tacos (chicken, salsa, vegetables, sour cream, guacamole) + beer or 4 slices of sausage and pepperoni thin crust pizza + Coke Zero + water Snk ( PM): beer Beverages: water (24 oz), coffee, beer  Usual physical activity: gardening  Estimated energy needs: 1800-2000 calories 200-225 g carbohydrates 135-150 g protein 50-56 g fat  Progress Towards Goal(s):  In progress.   Nutritional Diagnosis:  NB-1.1 Food and nutrition-related knowledge deficit As related to uncertainty how to apply nutrition information.  As evidenced by verbalizes incomplete information.    Intervention: Nutrition education and counseling. Pt was educated on the benefits of eating a variety of food groups at each meal. Discussed the purpose of each food group and ways to create balance with already established regimen. Discussed eating every 3-5 hours to help adequately nourish body and ways to increase water intake. Discussed  importance of physical activity. Pt was in agreement with goals listed.   Goals:  Aim to increase joyful movement to at least 20 min, 2 days/week. - Aim to have afternoon snack around 2-3 pm  - Aim to have vegetables when having pizza.  - Aim to increase water intake. Grab a bottle of water when leaving home to drink while driving.   Teaching Method Utilized:  Visual Auditory Hands on  Handouts given during visit include:  none  Barriers to  learning/adherence to lifestyle change: none identified  Demonstrated degree of understanding via:  Teach Back   Monitoring/Evaluation:  Dietary intake, exercise, and body weight in 1 month(s).

## 2019-11-24 ENCOUNTER — Ambulatory Visit: Payer: BC Managed Care – PPO | Admitting: Registered"

## 2020-02-05 DIAGNOSIS — Z1231 Encounter for screening mammogram for malignant neoplasm of breast: Secondary | ICD-10-CM | POA: Diagnosis not present

## 2020-04-06 DIAGNOSIS — J3089 Other allergic rhinitis: Secondary | ICD-10-CM | POA: Diagnosis not present

## 2020-04-06 DIAGNOSIS — Z0000 Encounter for general adult medical examination without abnormal findings: Secondary | ICD-10-CM | POA: Diagnosis not present

## 2020-04-06 DIAGNOSIS — Z1329 Encounter for screening for other suspected endocrine disorder: Secondary | ICD-10-CM | POA: Diagnosis not present

## 2020-04-06 DIAGNOSIS — Z7951 Long term (current) use of inhaled steroids: Secondary | ICD-10-CM | POA: Diagnosis not present

## 2020-04-06 DIAGNOSIS — Z0001 Encounter for general adult medical examination with abnormal findings: Secondary | ICD-10-CM | POA: Diagnosis not present

## 2020-04-06 DIAGNOSIS — Z13220 Encounter for screening for lipoid disorders: Secondary | ICD-10-CM | POA: Diagnosis not present

## 2020-04-06 DIAGNOSIS — T7800XA Anaphylactic reaction due to unspecified food, initial encounter: Secondary | ICD-10-CM | POA: Diagnosis not present

## 2020-04-06 DIAGNOSIS — Z79899 Other long term (current) drug therapy: Secondary | ICD-10-CM | POA: Diagnosis not present

## 2020-04-06 DIAGNOSIS — Z91013 Allergy to seafood: Secondary | ICD-10-CM | POA: Diagnosis not present

## 2020-08-04 NOTE — Progress Notes (Signed)
Cardiology Office Note:    Date:  08/05/2020   ID:  Dorothy Aguilar, DOB 02-18-1981, MRN [id number]  PCP:  Juanita Laster, MD  Cardiologist:  No primary care provider on file.  Electrophysiologist:  None   Referring MD: Juanita Laster, MD   Chief Complaint  Patient presents with  . Irregular Heart Beat    History of Present Illness:    Dorothy Aguilar is a 40 y.o. female with a hx of SVT who presents for follow-up.   She reports that she has had episodes of palpitations for years, where she feels like her heart is racing.  States that she will usually try coughing and episodes will resolve.  Typically last no more than 5 seconds.  However she presented to the ED on 01/02/2019 with a prolonged episode of palpitations.  She reports that she had started tennis lessons the day prior and had overexerted herself.  Also had been drinking wine that night.  Said she felt like she was dehydrated.  She tried coughing when the palpitations started, but had no resolution.  She called EMS, who checked her rhythm on arrival and found that she was in SVT with heart rate above 200.  They tried vagal maneuvers but did not resolve.  She was given IV adenosine with return to sinus rhythm.  She reports that following that episode in July 2020 she had another episode where she will was dropping her kids off from school and noted that she had palpitations that lasted about 10 seconds, resolved with coughing.  She saw cardiologist in 2011 for SVT.  Had an echocardiogram which was unremarkable.  She was drinking 4 cups of coffee each morning and another in the afternoon, but after this episode in July and she cut back to just 2 cups of coffee in the morning.  She reports that she drinks wine 5 nights per week, 2 glasses per night.  She reports no history of heart disease in her immediate family, but both her mother and grandmother had thyroid disease.  She denies any smoking history.  Cardiac monitor on 06/03/2019 showed  episode of SVT with heart rate 150 on 12/18.  Drank 3 glasses of red wine that night.  Woke up in middle of night with symptoms.  Felt better at 10AM the next morning.  No episodes since.  No lightheadedness.  Stopped drinking wine in December 2020.  Has vodka-tonic 4 times per week.  Decreased coffee to 1/2 cup daily.  Since last clinic visit, she reports that she had 1 episode of SVT, occurred on 05/21/2020.  States that she was visiting family over the holidays and had a glass of wine and ate a big lunch.  Went into SVT, lasted 7-8 minutes and resolved.  Reports she has been worried about exercising due to her SVT and has gained 35 pounds in last year.  Denies any chest pain, dyspnea, lightheadedness, syncope, lower extremity edema.      Past Medical History:  Diagnosis Date  . Depression    mild pp depression  . GERD (gastroesophageal reflux disease)   . Hx of varicella   . Palpitations     Past Surgical History:  Procedure Laterality Date  . SHOULDER BIOPSY  11/2007  . WISDOM TOOTH EXTRACTION      Current Medications: Current Meds  Medication Sig  . EPINEPHrine 0.3 mg/0.3 mL IJ SOAJ injection Inject into the muscle.     Allergies:   Aspirin, Codeine, Motrin [  ibuprofen], Other, Penicillins, Shellfish allergy, and Sulfonamide derivatives   Social History   Socioeconomic History  . Marital status: Married    Spouse name: Not on file  . Number of children: Not on file  . Years of education: Not on file  . Highest education level: Not on file  Occupational History  . Not on file  Tobacco Use  . Smoking status: Never Smoker  . Smokeless tobacco: Never Used  Substance and Sexual Activity  . Alcohol use: Yes  . Drug use: No  . Sexual activity: Yes  Other Topics Concern  . Not on file  Social History Narrative  . Not on file   Social Determinants of Health   Financial Resource Strain: Not on file  Food Insecurity: Not on file  Transportation Needs: Not on file   Physical Activity: Not on file  Stress: Not on file  Social Connections: Not on file     Family History: The patient's family history includes Allergic rhinitis in her mother; Cancer in an other family member; Food Allergy in her mother; Hypertension in her maternal grandfather, maternal grandmother, and mother; Osteoporosis in her maternal grandmother, mother, and paternal grandmother; Thyroid disease in her maternal grandmother and mother. There is no history of Angioedema, Asthma, Eczema, Immunodeficiency, or Urticaria.  ROS:   Please see the history of present illness.     All other systems reviewed and are negative.  EKGs/Labs/Other Studies Reviewed:    The following studies were reviewed today:   EKG:  EKG is ordered today.  The last ekg ordered demonstrates normal sinus rhythm, right axis deviation, rate 80, no ST/T abnormalities  EKG from EMS run sheet 01/02/19: SVT, rate 200    Recent Labs: No results found for requested labs within last 8760 hours.  Recent Lipid Panel No results found for: CHOL, TRIG, HDL, CHOLHDL, VLDL, LDLCALC, LDLDIRECT   Cardiac monitor 06/03/19:  Episode of likely SVT with HR 150 bpm at 2AM on 12/18; there is significant artifact during episode. Unclear duration, next recording at 5AM appears to show sinus tachycardia.   Predominant rhythm is sinus rhythm. Range is 60 to 166 bpm with average of 83 bpm. No atrial fibrillation, sustained ventricular tachycardia, significant pause, or high degree AV block. Total ectopy <1%. 3 patient triggered events, which corresponded to sinus rhythm.  Episode of likely SVT with HR 150 bpm at 2AM on 12/18; there is significant artifact during this episode.  Unclear duration, next recording at 5AM appears to show sinus tachycardia.  Physical Exam:    VS:  BP 124/72   Pulse 80   Ht 5\' 6"  (1.676 m)   Wt 224 lb (101.6 kg)   SpO2 96%   BMI 36.15 kg/m     Wt Readings from Last 3 Encounters:  08/05/20 224 lb  (101.6 kg)  08/20/19 198 lb 12.8 oz (90.2 kg)  08/06/19 196 lb (88.9 kg)     GEN:  Well nourished, well developed in no acute distress HEENT: Normal NECK: No JVD LYMPHATICS: No lymphadenopathy CARDIAC: RRR, no murmurs, rubs, gallops RESPIRATORY:  Clear to auscultation without rales, wheezing or rhonchi  ABDOMEN: Soft, non-tender, non-distended MUSCULOSKELETAL:  No edema; No deformity  SKIN: Warm and dry NEUROLOGIC:  Alert and oriented x 3 PSYCHIATRIC:  Normal affect   ASSESSMENT:    1. SVT (supraventricular tachycardia) (HCC)    PLAN:    In order of problems listed above:  SVT: Infrequent episodes, but episode in July 2020 required adenosine  administration by EMS to break.  Cardiac monitor showed another episode in December 2020 that occurred after drinking 3 glasses of wine.  Had episode in December 2021 after drinking glass of wine.  Alcohol seems to be significant trigger, recommend avoiding.  She states that she is gained 35 pounds in the last year because she is worried about exercising causing SVT.  Reports she has been having significant anxiety about her SVT and is interested in ablation. -Echocardiogram -She is interested in ablation, will refer to EP   RTC in 1 year   Medication Adjustments/Labs and Tests Ordered: Current medicines are reviewed at length with the patient today.  Concerns regarding medicines are outlined above.  Orders Placed This Encounter  Procedures  . Ambulatory referral to Cardiac Electrophysiology  . EKG 12-Lead  . ECHOCARDIOGRAM COMPLETE   No orders of the defined types were placed in this encounter.   Patient Instructions  Medication Instructions:  Your physician recommends that you continue on your current medications as directed. Please refer to the Current Medication list given to you today.  *If you need a refill on your cardiac medications before your next appointment, please call your pharmacy*  Testing/Procedures: Your  physician has requested that you have an echocardiogram. Echocardiography is a painless test that uses sound waves to create images of your heart. It provides your doctor with information about the size and shape of your heart and how well your heart's chambers and valves are working. This procedure takes approximately one hour. There are no restrictions for this procedure.  This will be done at our [facility] location:  [address]  Follow-Up: At [facility], you and your health needs are our priority.  As part of our continuing mission to provide you with exceptional heart care, we have created designated Provider Care Teams.  These Care Teams include your primary Cardiologist (physician) and Advanced Practice Providers (APPs -  Physician Assistants and Nurse Practitioners) who all work together to provide you with the care you need, when you need it.  We recommend signing up for the patient portal called "MyChart".  Sign up information is provided on this After Visit Summary.  MyChart is used to connect with patients for Virtual Visits (Telemedicine).  Patients are able to view lab/test results, encounter notes, upcoming appointments, etc.  Non-urgent messages can be sent to your provider as well.   To learn more about what you can do with MyChart, go to [email or web address].    Your next appointment:   6 month(s)  The format for your next appointment:   In Person  Provider:   Epifanio Lesches, MD   Other Instructions You have been referred to: Cardiac Electrophysiologist      Signed, Little Ishikawa, MD  08/05/2020 9:54 AM    [facility]

## 2020-08-05 ENCOUNTER — Encounter: Payer: Self-pay | Admitting: Cardiology

## 2020-08-05 ENCOUNTER — Other Ambulatory Visit: Payer: Self-pay

## 2020-08-05 ENCOUNTER — Ambulatory Visit (INDEPENDENT_AMBULATORY_CARE_PROVIDER_SITE_OTHER): Payer: BC Managed Care – PPO | Admitting: Cardiology

## 2020-08-05 VITALS — BP 124/72 | HR 80 | Ht 66.0 in | Wt 224.0 lb

## 2020-08-05 DIAGNOSIS — I471 Supraventricular tachycardia, unspecified: Secondary | ICD-10-CM

## 2020-08-05 NOTE — Patient Instructions (Signed)
Medication Instructions:  Your physician recommends that you continue on your current medications as directed. Please refer to the Current Medication list given to you today.  *If you need a refill on your cardiac medications before your next appointment, please call your pharmacy*  Testing/Procedures: Your physician has requested that you have an echocardiogram. Echocardiography is a painless test that uses sound waves to create images of your heart. It provides your doctor with information about the size and shape of your heart and how well your heart's chambers and valves are working. This procedure takes approximately one hour. There are no restrictions for this procedure.  This will be done at our [facility] location:  [address]  Follow-Up: At [facility], you and your health needs are our priority.  As part of our continuing mission to provide you with exceptional heart care, we have created designated Provider Care Teams.  These Care Teams include your primary Cardiologist (physician) and Advanced Practice Providers (APPs -  Physician Assistants and Nurse Practitioners) who all work together to provide you with the care you need, when you need it.  We recommend signing up for the patient portal called "MyChart".  Sign up information is provided on this After Visit Summary.  MyChart is used to connect with patients for Virtual Visits (Telemedicine).  Patients are able to view lab/test results, encounter notes, upcoming appointments, etc.  Non-urgent messages can be sent to your provider as well.   To learn more about what you can do with MyChart, go to [email or web address].    Your next appointment:   6 month(s)  The format for your next appointment:   In Person  Provider:   Epifanio Lesches, MD   Other Instructions You have been referred to: Cardiac Electrophysiologist

## 2020-08-16 ENCOUNTER — Ambulatory Visit (INDEPENDENT_AMBULATORY_CARE_PROVIDER_SITE_OTHER): Payer: BC Managed Care – PPO | Admitting: Internal Medicine

## 2020-08-16 ENCOUNTER — Encounter: Payer: Self-pay | Admitting: Internal Medicine

## 2020-08-16 ENCOUNTER — Other Ambulatory Visit: Payer: Self-pay

## 2020-08-16 DIAGNOSIS — I471 Supraventricular tachycardia, unspecified: Secondary | ICD-10-CM | POA: Insufficient documentation

## 2020-08-16 NOTE — Patient Instructions (Addendum)
Medication Instructions:  Your physician recommends that you continue on your current medications as directed. Please refer to the Current Medication list given to you today.  Labwork: You will get lab work today:  BMP and CBC  Testing/Procedures: None ordered.  Follow-Up:  SEE INSTRUCTION LETTER  Any Other Special Instructions Will Be Listed Below (If Applicable).  If you need a refill on your cardiac medications before your next appointment, please call your pharmacy.    Cardiac electrophysiology: From cell to bedside (7th ed., pp. 1239-1252). [place], [place]: Elsevier.">  Cardiac Ablation Cardiac ablation is a procedure to destroy, or ablate, a small amount of heart tissue in very specific places. The heart has many electrical connections. Sometimes these connections are abnormal and can cause the heart to beat very fast or irregularly. Ablating some of the areas that cause problems can improve the heart's rhythm or return it to normal. Ablation may be done for people who:  Have Wolff-Parkinson-White syndrome.  Have fast heart rhythms (tachycardia).  Have taken medicines for an abnormal heart rhythm (arrhythmia) that were not effective or caused side effects.  Have a high-risk heartbeat that may be life-threatening. During the procedure, a small incision is made in the neck or the groin, and a long, thin tube (catheter) is inserted into the incision and moved to the heart. Small devices (electrodes) on the tip of the catheter will send out electrical currents. A type of X-ray (fluoroscopy) will be used to help guide the catheter and to provide images of the heart. Tell a health care provider about:  Any allergies you have.  All medicines you are taking, including vitamins, herbs, eye drops, creams, and over-the-counter medicines.  Any problems you or family members have had with anesthetic medicines.  Any blood disorders you have.  Any surgeries you have had.  Any  medical conditions you have, such as kidney failure.  Whether you are pregnant or may be pregnant. What are the risks? Generally, this is a safe procedure. However, problems may occur, including:  Infection.  Bruising and bleeding at the catheter insertion site.  Bleeding into the chest, especially into the sac that surrounds the heart. This is a serious complication.  Stroke or blood clots.  Damage to nearby structures or organs.  Allergic reaction to medicines or dyes.  Need for a permanent pacemaker if the normal electrical system is damaged. A pacemaker is a small computer that sends electrical signals to the heart and helps your heart beat normally.  The procedure not being fully effective. This may not be recognized until months later. Repeat ablation procedures are sometimes done. What happens before the procedure? Medicines Ask your health care provider about:  Changing or stopping your regular medicines. This is especially important if you are taking diabetes medicines or blood thinners.  Taking medicines such as aspirin and ibuprofen. These medicines can thin your blood. Do not take these medicines unless your health care provider tells you to take them.  Taking over-the-counter medicines, vitamins, herbs, and supplements. General instructions  Follow instructions from your health care provider about eating or drinking restrictions.  Plan to have someone take you home from the hospital or clinic.  If you will be going home right after the procedure, plan to have someone with you for 24 hours.  Ask your health care provider what steps will be taken to prevent infection. What happens during the procedure?  An IV will be inserted into one of your veins.  You will be   given a medicine to help you relax (sedative).  The skin on your neck or groin will be numbed.  An incision will be made in your neck or your groin.  A needle will be inserted through the incision  and into a large vein in your neck or groin.  A catheter will be inserted into the needle and moved to your heart.  Dye may be injected through the catheter to help your surgeon see the area of the heart that needs treatment.  Electrical currents will be sent from the catheter to ablate heart tissue in desired areas. There are three types of energy that may be used to do this: ? Heat (radiofrequency energy). ? Laser energy. ? Extreme cold (cryoablation).  When the tissue has been ablated, the catheter will be removed.  Pressure will be held on the insertion area to prevent a lot of bleeding.  A bandage (dressing) will be placed over the insertion area. The exact procedure may vary among health care providers and hospitals.   What happens after the procedure?  Your blood pressure, heart rate, breathing rate, and blood oxygen level will be monitored until you leave the hospital or clinic.  Your insertion area will be monitored for bleeding. You will need to lie still for a few hours to ensure that you do not bleed from the insertion area.  Do not drive for 24 hours or as long as told by your health care provider. Summary  Cardiac ablation is a procedure to destroy, or ablate, a small amount of heart tissue using an electrical current. This procedure can improve the heart rhythm or return it to normal.  Tell your health care provider about any medical conditions you may have and all medicines you are taking to treat them.  This is a safe procedure, but problems may occur. Problems may include infection, bruising, damage to nearby organs or structures, or allergic reactions to medicines.  Follow your health care provider's instructions about eating and drinking before the procedure. You may also be told to change or stop some of your medicines.  After the procedure, do not drive for 24 hours or as long as told by your health care provider. This information is not intended to replace  advice given to you by your health care provider. Make sure you discuss any questions you have with your health care provider. Document Revised: 03/30/2019 Document Reviewed: 03/30/2019 Elsevier Patient Education  2021 Elsevier Inc.   

## 2020-08-16 NOTE — Progress Notes (Signed)
HPI [name] is referred today by Dr. Jerene Aguilar for evaluation of SVT. She is a pleasant 40 yo woman with a h/o palpitations dating back over 10 years. She did not seek medical attention until a couple of years ago and has had episodes of SVT at rates of up to 200/min, terminated by IV adenosine. She gets palpitations and sob and chest pressure with the spells and usually cannot break with vagal maneuvers. She has begun to become more anxious about when the next episode will occur. She desires not to take daily beta blocker due to her already low bp. She is frustrated by weight gain as she has been worried about exercise exacerbating the SVT.  Allergies  Allergen Reactions  . Aspirin Anaphylaxis and Other (See Comments)    Lips and mouth swelling  . Codeine Swelling  . Motrin [Ibuprofen]   . Other     Nuts, mouth and lips swell, apples and cherries mouth swells  . Penicillins Swelling  . Shellfish Allergy   . Sulfonamide Derivatives Swelling     Current Outpatient Medications  Medication Sig Dispense Refill  . acetaminophen (TYLENOL) 500 MG tablet Take 1,000 mg by mouth every 6 (six) hours as needed for pain.    Marland Kitchen EPINEPHrine 0.3 mg/0.3 mL IJ SOAJ injection Inject into the muscle.    . fexofenadine (ALLEGRA) 180 MG tablet Take by mouth.    . Multiple Vitamin (MULTIVITAMIN) tablet Take 1 tablet by mouth daily.     No current facility-administered medications for this visit.     Past Medical History:  Diagnosis Date  . Depression    mild pp depression  . GERD (gastroesophageal reflux disease)   . Hx of varicella   . Palpitations     ROS:   All systems reviewed and negative except as noted in the HPI.   Past Surgical History:  Procedure Laterality Date  . SHOULDER BIOPSY  11/2007  . WISDOM TOOTH EXTRACTION       Family History  Problem Relation Age of Onset  . Hypertension Mother   . Thyroid disease Mother   . Osteoporosis Mother   . Allergic rhinitis  Mother   . Food Allergy Mother   . Hypertension Maternal Grandmother   . Thyroid disease Maternal Grandmother   . Osteoporosis Maternal Grandmother   . Hypertension Maternal Grandfather   . Osteoporosis Paternal Grandmother   . Cancer Other   . Angioedema Neg Hx   . Asthma Neg Hx   . Eczema Neg Hx   . Immunodeficiency Neg Hx   . Urticaria Neg Hx      Social History   Socioeconomic History  . Marital status: Married    Spouse name: Not on file  . Number of children: Not on file  . Years of education: Not on file  . Highest education level: Not on file  Occupational History  . Not on file  Tobacco Use  . Smoking status: Never Smoker  . Smokeless tobacco: Never Used  Substance and Sexual Activity  . Alcohol use: Yes  . Drug use: No  . Sexual activity: Yes  Other Topics Concern  . Not on file  Social History Narrative  . Not on file   Social Determinants of Health   Financial Resource Strain: Not on file  Food Insecurity: Not on file  Transportation Needs: Not on file  Physical Activity: Not on file  Stress: Not on file  Social Connections: Not on file  Intimate Partner Violence: Not on file     BP 112/82   Pulse 90   Ht 5' 6" (1.676 m)   Wt 218 lb 3.2 oz (99 kg)   SpO2 95%   BMI 35.22 kg/m   Physical Exam:  Well appearing NAD HEENT: Unremarkable Neck:  No JVD, no thyromegally Lymphatics:  No adenopathy Back:  No CVA tenderness Lungs:  Clear with no wheezes HEART:  Regular rate rhythm, no murmurs, no rubs, no clicks Abd:  soft, positive bowel sounds, no organomegally, no rebound, no guarding Ext:  2 plus pulses, no edema, no cyanosis, no clubbing Skin:  No rashes no nodules Neuro:  CN II through XII intact, motor grossly intact  EKG - nsr with no pre-excitation  Assess/Plan: 1. SVT - I have discussed the treatment options in detail and the risks/benefits/goals/expectations of EP study and catheter ablation were reviewed and she wishes to  proceed. 2. Obesity - she hopes to begin working out after ablation.  Gregg Taylor,MD 

## 2020-08-16 NOTE — H&P (View-Only) (Signed)
HPI [name] is referred today by Dr. Jerene Pitch for evaluation of SVT. She is a pleasant 40 yo woman with a h/o palpitations dating back over 10 years. She did not seek medical attention until a couple of years ago and has had episodes of SVT at rates of up to 200/min, terminated by IV adenosine. She gets palpitations and sob and chest pressure with the spells and usually cannot break with vagal maneuvers. She has begun to become more anxious about when the next episode will occur. She desires not to take daily beta blocker due to her already low bp. She is frustrated by weight gain as she has been worried about exercise exacerbating the SVT.  Allergies  Allergen Reactions  . Aspirin Anaphylaxis and Other (See Comments)    Lips and mouth swelling  . Codeine Swelling  . Motrin [Ibuprofen]   . Other     Nuts, mouth and lips swell, apples and cherries mouth swells  . Penicillins Swelling  . Shellfish Allergy   . Sulfonamide Derivatives Swelling     Current Outpatient Medications  Medication Sig Dispense Refill  . acetaminophen (TYLENOL) 500 MG tablet Take 1,000 mg by mouth every 6 (six) hours as needed for pain.    Marland Kitchen EPINEPHrine 0.3 mg/0.3 mL IJ SOAJ injection Inject into the muscle.    . fexofenadine (ALLEGRA) 180 MG tablet Take by mouth.    . Multiple Vitamin (MULTIVITAMIN) tablet Take 1 tablet by mouth daily.     No current facility-administered medications for this visit.     Past Medical History:  Diagnosis Date  . Depression    mild pp depression  . GERD (gastroesophageal reflux disease)   . Hx of varicella   . Palpitations     ROS:   All systems reviewed and negative except as noted in the HPI.   Past Surgical History:  Procedure Laterality Date  . SHOULDER BIOPSY  11/2007  . WISDOM TOOTH EXTRACTION       Family History  Problem Relation Age of Onset  . Hypertension Mother   . Thyroid disease Mother   . Osteoporosis Mother   . Allergic rhinitis  Mother   . Food Allergy Mother   . Hypertension Maternal Grandmother   . Thyroid disease Maternal Grandmother   . Osteoporosis Maternal Grandmother   . Hypertension Maternal Grandfather   . Osteoporosis Paternal Grandmother   . Cancer Other   . Angioedema Neg Hx   . Asthma Neg Hx   . Eczema Neg Hx   . Immunodeficiency Neg Hx   . Urticaria Neg Hx      Social History   Socioeconomic History  . Marital status: Married    Spouse name: Not on file  . Number of children: Not on file  . Years of education: Not on file  . Highest education level: Not on file  Occupational History  . Not on file  Tobacco Use  . Smoking status: Never Smoker  . Smokeless tobacco: Never Used  Substance and Sexual Activity  . Alcohol use: Yes  . Drug use: No  . Sexual activity: Yes  Other Topics Concern  . Not on file  Social History Narrative  . Not on file   Social Determinants of Health   Financial Resource Strain: Not on file  Food Insecurity: Not on file  Transportation Needs: Not on file  Physical Activity: Not on file  Stress: Not on file  Social Connections: Not on file  Intimate Partner Violence: Not on file     BP 112/82   Pulse 90   Ht 5\' 6"  (1.676 m)   Wt 218 lb 3.2 oz (99 kg)   SpO2 95%   BMI 35.22 kg/m   Physical Exam:  Well appearing NAD HEENT: Unremarkable Neck:  No JVD, no thyromegally Lymphatics:  No adenopathy Back:  No CVA tenderness Lungs:  Clear with no wheezes HEART:  Regular rate rhythm, no murmurs, no rubs, no clicks Abd:  soft, positive bowel sounds, no organomegally, no rebound, no guarding Ext:  2 plus pulses, no edema, no cyanosis, no clubbing Skin:  No rashes no nodules Neuro:  CN II through XII intact, motor grossly intact  EKG - nsr with no pre-excitation  Assess/Plan: 1. SVT - I have discussed the treatment options in detail and the risks/benefits/goals/expectations of EP study and catheter ablation were reviewed and she wishes to  proceed. 2. Obesity - she hopes to begin working out after ablation.  Alie Moudy,MD

## 2020-08-17 LAB — BASIC METABOLIC PANEL
BUN/Creatinine Ratio: 18 (ref 9–23)
BUN: 15 mg/dL (ref 6–24)
CO2: 21 mmol/L (ref 20–29)
Calcium: 9.4 mg/dL (ref 8.7–10.2)
Chloride: 101 mmol/L (ref 96–106)
Creatinine, Ser: 0.85 mg/dL (ref 0.57–1.00)
Glucose: 90 mg/dL (ref 65–99)
Potassium: 4.7 mmol/L (ref 3.5–5.2)
Sodium: 140 mmol/L (ref 134–144)
eGFR: 89 mL/min/{1.73_m2} (ref >59)

## 2020-08-17 LAB — CBC WITH DIFFERENTIAL/PLATELET
Basophils Absolute: 0.1 10*3/uL (ref 0.0–0.2)
Basos: 1 %
EOS (ABSOLUTE): 0.2 10*3/uL (ref 0.0–0.4)
Eos: 3 %
Hematocrit: 42.2 % (ref 34.0–46.6)
Hemoglobin: 14.3 g/dL (ref 11.1–15.9)
Immature Grans (Abs): 0 10*3/uL (ref 0.0–0.1)
Immature Granulocytes: 0 %
Lymphocytes Absolute: 2.2 10*3/uL (ref 0.7–3.1)
Lymphs: 30 %
MCH: 32.3 pg (ref 26.6–33.0)
MCHC: 33.9 g/dL (ref 31.5–35.7)
MCV: 95 fL (ref 79–97)
Monocytes Absolute: 0.5 10*3/uL (ref 0.1–0.9)
Monocytes: 7 %
Neutrophils Absolute: 4.2 10*3/uL (ref 1.4–7.0)
Neutrophils: 59 %
Platelets: 301 10*3/uL (ref 150–450)
RBC: 4.43 x10E6/uL (ref 3.77–5.28)
RDW: 12.1 % (ref 11.7–15.4)
WBC: 7.2 10*3/uL (ref 3.4–10.8)

## 2020-08-26 ENCOUNTER — Ambulatory Visit (HOSPITAL_COMMUNITY): Payer: BC Managed Care – PPO | Attending: Cardiology

## 2020-08-26 ENCOUNTER — Other Ambulatory Visit: Payer: Self-pay

## 2020-08-26 DIAGNOSIS — I471 Supraventricular tachycardia: Secondary | ICD-10-CM | POA: Diagnosis present

## 2020-08-26 LAB — ECHOCARDIOGRAM COMPLETE
Area-P 1/2: 3.21 cm2
S' Lateral: 3.3 cm

## 2020-09-06 ENCOUNTER — Other Ambulatory Visit (HOSPITAL_COMMUNITY)
Admission: RE | Admit: 2020-09-06 | Discharge: 2020-09-06 | Disposition: A | Discharge status: Home / Self Care | Payer: BC Managed Care – PPO | Source: Ambulatory Visit | Attending: Internal Medicine | Admitting: Internal Medicine

## 2020-09-06 DIAGNOSIS — Z885 Allergy status to narcotic agent status: Secondary | ICD-10-CM | POA: Diagnosis not present

## 2020-09-06 DIAGNOSIS — Z01812 Encounter for preprocedural laboratory examination: Secondary | ICD-10-CM | POA: Insufficient documentation

## 2020-09-06 DIAGNOSIS — Z6835 Body mass index (BMI) 35.0-35.9, adult: Secondary | ICD-10-CM | POA: Diagnosis not present

## 2020-09-06 DIAGNOSIS — Z880 Allergy status to penicillin: Secondary | ICD-10-CM | POA: Diagnosis not present

## 2020-09-06 DIAGNOSIS — E669 Obesity, unspecified: Secondary | ICD-10-CM | POA: Diagnosis not present

## 2020-09-06 DIAGNOSIS — Z882 Allergy status to sulfonamides status: Secondary | ICD-10-CM | POA: Diagnosis not present

## 2020-09-06 DIAGNOSIS — Z20822 Contact with and (suspected) exposure to covid-19: Secondary | ICD-10-CM | POA: Insufficient documentation

## 2020-09-06 DIAGNOSIS — Z886 Allergy status to analgesic agent status: Secondary | ICD-10-CM | POA: Diagnosis not present

## 2020-09-06 DIAGNOSIS — I471 Supraventricular tachycardia: Secondary | ICD-10-CM | POA: Diagnosis present

## 2020-09-06 LAB — SARS CORONAVIRUS 2 (TAT 6-24 HRS): SARS Coronavirus 2: NEGATIVE

## 2020-09-07 ENCOUNTER — Telehealth: Payer: Self-pay | Admitting: Internal Medicine

## 2020-09-07 NOTE — Telephone Encounter (Signed)
Pt is having a procedure 09/08/2020 but she states she have not been given any Pre Op  instructions on if she can eat or drink before her procedure. Please advise

## 2020-09-07 NOTE — Telephone Encounter (Signed)
See MyChart response.  Sent instruction letter via [employer].  Instructions read by Pt.  No further action needed.

## 2020-09-07 NOTE — Pre-Procedure Instructions (Signed)
Instructed patient on the following items: °Arrival time 0530 °Nothing to eat or drink after midnight °No meds AM of procedure °Responsible person to drive you home and stay with you for 24 hrs ° ° °   °

## 2020-09-08 ENCOUNTER — Ambulatory Visit (HOSPITAL_COMMUNITY)
Admission: RE | Disposition: A | Discharge status: Home / Self Care | Payer: Self-pay | Source: Ambulatory Visit | Attending: Internal Medicine

## 2020-09-08 ENCOUNTER — Ambulatory Visit (HOSPITAL_COMMUNITY)
Admission: RE | Admit: 2020-09-08 | Discharge: 2020-09-08 | Disposition: A | Discharge status: Home / Self Care | Payer: BC Managed Care – PPO | Source: Ambulatory Visit | Attending: Internal Medicine | Admitting: Internal Medicine

## 2020-09-08 DIAGNOSIS — I471 Supraventricular tachycardia: Secondary | ICD-10-CM | POA: Insufficient documentation

## 2020-09-08 DIAGNOSIS — Z886 Allergy status to analgesic agent status: Secondary | ICD-10-CM | POA: Diagnosis not present

## 2020-09-08 DIAGNOSIS — Z20822 Contact with and (suspected) exposure to covid-19: Secondary | ICD-10-CM | POA: Insufficient documentation

## 2020-09-08 DIAGNOSIS — Z880 Allergy status to penicillin: Secondary | ICD-10-CM | POA: Insufficient documentation

## 2020-09-08 DIAGNOSIS — E669 Obesity, unspecified: Secondary | ICD-10-CM | POA: Insufficient documentation

## 2020-09-08 DIAGNOSIS — Z6835 Body mass index (BMI) 35.0-35.9, adult: Secondary | ICD-10-CM | POA: Insufficient documentation

## 2020-09-08 DIAGNOSIS — Z885 Allergy status to narcotic agent status: Secondary | ICD-10-CM | POA: Diagnosis not present

## 2020-09-08 DIAGNOSIS — Z882 Allergy status to sulfonamides status: Secondary | ICD-10-CM | POA: Insufficient documentation

## 2020-09-08 HISTORY — PX: SVT ABLATION: EP1225

## 2020-09-08 LAB — PREGNANCY, URINE: Preg Test, Ur: NEGATIVE

## 2020-09-08 SURGERY — SVT ABLATION

## 2020-09-08 MED ORDER — MIDAZOLAM HCL 5 MG/5ML IJ SOLN
INTRAMUSCULAR | Status: DC | PRN
  Administered 2020-09-08: 1 mg via INTRAVENOUS
  Administered 2020-09-08 (×2): 2 mg via INTRAVENOUS
  Administered 2020-09-08: 1 mg via INTRAVENOUS
  Administered 2020-09-08: 2 mg via INTRAVENOUS
  Administered 2020-09-08 (×2): 1 mg via INTRAVENOUS
  Administered 2020-09-08: 2 mg via INTRAVENOUS

## 2020-09-08 MED ORDER — SODIUM CHLORIDE 0.9 % IV SOLN
INTRAVENOUS | Status: DC | PRN
  Administered 2020-09-08: 4 ug/min via INTRAVENOUS

## 2020-09-08 MED ORDER — ACETAMINOPHEN 325 MG PO TABS
650.0000 mg | ORAL_TABLET | ORAL | Status: DC | PRN
  Administered 2020-09-08: 650 mg via ORAL
  Filled 2020-09-08 (×3): qty 2

## 2020-09-08 MED ORDER — FENTANYL CITRATE (PF) 100 MCG/2ML IJ SOLN
INTRAMUSCULAR | Status: AC
  Filled 2020-09-08: qty 2

## 2020-09-08 MED ORDER — HEPARIN (PORCINE) IN NACL 1000-0.9 UT/500ML-% IV SOLN
INTRAVENOUS | Status: DC | PRN
  Administered 2020-09-08: 500 mL

## 2020-09-08 MED ORDER — MIDAZOLAM HCL 5 MG/5ML IJ SOLN
INTRAMUSCULAR | Status: AC
  Filled 2020-09-08: qty 5

## 2020-09-08 MED ORDER — SODIUM CHLORIDE 0.9% FLUSH: 3.0000 mL | Freq: Two times a day (BID) | INTRAVENOUS | Status: DC

## 2020-09-08 MED ORDER — ONDANSETRON HCL 4 MG/2ML IJ SOLN: 4.0000 mg | Freq: Four times a day (QID) | INTRAMUSCULAR | Status: DC | PRN

## 2020-09-08 MED ORDER — BUPIVACAINE HCL (PF) 0.25 % IJ SOLN
INTRAMUSCULAR | Status: AC
  Filled 2020-09-08: qty 60

## 2020-09-08 MED ORDER — ISOPROTERENOL HCL 0.2 MG/ML IJ SOLN
INTRAMUSCULAR | Status: AC
  Filled 2020-09-08: qty 5

## 2020-09-08 MED ORDER — SODIUM CHLORIDE 0.9 % IV SOLN: 250.0000 mL | INTRAVENOUS | Status: DC | PRN

## 2020-09-08 MED ORDER — FENTANYL CITRATE (PF) 100 MCG/2ML IJ SOLN
INTRAMUSCULAR | Status: DC | PRN
  Administered 2020-09-08 (×2): 25 ug via INTRAVENOUS
  Administered 2020-09-08 (×2): 12.5 ug via INTRAVENOUS
  Administered 2020-09-08: 25 ug via INTRAVENOUS
  Administered 2020-09-08 (×2): 12.5 ug via INTRAVENOUS

## 2020-09-08 MED ORDER — BUPIVACAINE HCL (PF) 0.25 % IJ SOLN
INTRAMUSCULAR | Status: AC
  Filled 2020-09-08: qty 30

## 2020-09-08 MED ORDER — HEPARIN (PORCINE) IN NACL 1000-0.9 UT/500ML-% IV SOLN
INTRAVENOUS | Status: AC
  Filled 2020-09-08: qty 500

## 2020-09-08 MED ORDER — SODIUM CHLORIDE 0.9 % IV SOLN: INTRAVENOUS | Status: DC

## 2020-09-08 MED ORDER — SODIUM CHLORIDE 0.9% FLUSH: 3.0000 mL | INTRAVENOUS | Status: DC | PRN

## 2020-09-08 SURGICAL SUPPLY — 11 items
BAG SNAP BAND KOVER 36X36 (MISCELLANEOUS) ×1 IMPLANT
CATH CELSIUS THERM D CV 7F (ABLATOR) ×1 IMPLANT
CATH JOSEPH QUAD ALLRED 6F REP (CATHETERS) ×2 IMPLANT
CATH JSN HEX 2-5-2 120 (CATHETERS) ×1 IMPLANT
PACK EP LATEX FREE (CUSTOM PROCEDURE TRAY) ×2
PACK EP LF (CUSTOM PROCEDURE TRAY) ×1 IMPLANT
PAD PRO RADIOLUCENT 2001M-C (PAD) ×2 IMPLANT
SHEATH PINNACLE 6F 10CM (SHEATH) ×2 IMPLANT
SHEATH PINNACLE 7F 10CM (SHEATH) ×1 IMPLANT
SHEATH PINNACLE 8F 10CM (SHEATH) ×1 IMPLANT
SHIELD RADPAD SCOOP 12X17 (MISCELLANEOUS) ×1 IMPLANT

## 2020-09-08 NOTE — Interval H&P Note (Signed)
History and Physical Interval Note:  09/08/2020 7:38 AM  Dorothy Aguilar  has presented today for surgery, with the diagnosis of svt.  The various methods of treatment have been discussed with the patient and family. After consideration of risks, benefits and other options for treatment, the patient has consented to  Procedure(s): SVT ABLATION (N/A) as a surgical intervention.  The patient's history has been reviewed, patient examined, no change in status, stable for surgery.  I have reviewed the patient's chart and labs.  Questions were answered to the patient's satisfaction.     Lewayne Bunting

## 2020-09-08 NOTE — Progress Notes (Signed)
Pt ambulated without difficulty or bleeding.   Discharged home with husband who will drive and stay with pt x 24 hrs.  Dr Jeani Sow assessed pt and released pt for discharge

## 2020-09-08 NOTE — Discharge Instructions (Signed)
Cardiac Ablation, Care After  This sheet gives you information about how to care for yourself after your procedure. Your health care provider may also give you more specific instructions. If you have problems or questions, contact your health care provider. What can I expect after the procedure? After the procedure, it is common to have:  Bruising around your puncture site.  Tenderness around your puncture site.  Skipped heartbeats.  Tiredness (fatigue).  Follow these instructions at home: Puncture site care   Follow instructions from your health care provider about how to take care of your puncture site. Make sure you: ? If present, leave stitches (sutures), skin glue, or adhesive strips in place. These skin closures may need to stay in place for up to 2 weeks. If adhesive strip edges start to loosen and curl up, you may trim the loose edges. Do not remove adhesive strips completely unless your health care provider tells you to do that. ? If a square bandage is present, this may be removed in 24 hours.   Check your puncture site every day for signs of infection. Check for: ? Redness, swelling, or pain. ? Fluid or blood. If your puncture site starts to bleed, lie down on your back, apply firm pressure to the area, and contact your health care provider. ? Warmth. ? Pus or a bad smell. Driving  Do not drive for at least 4 days after your procedure or however long your health care provider recommends. (Do not resume driving if you have previously been instructed not to drive for other health reasons.)  Do not drive or use heavy machinery while taking prescription pain medicine. Activity  Avoid activities that take a lot of effort for at least 7 days after your procedure.  Do not lift anything that is heavier than 5 lb (4.5 kg) for one week.   No sexual activity for 1 week.   Return to your normal activities as told by your health care provider. Ask your health care provider what  activities are safe for you. General instructions  Take over-the-counter and prescription medicines only as told by your health care provider.  Do not use any products that contain nicotine or tobacco, such as cigarettes and e-cigarettes. If you need help quitting, ask your health care provider.  You may shower after 24 hours, but Do not take baths, swim, or use a hot tub for 1 week.   Do not drink alcohol for 24 hours after your procedure.  Keep all follow-up visits as told by your health care provider. This is important. Contact a health care provider if:  You have redness, mild swelling, or pain around your puncture site.  You have fluid or blood coming from your puncture site that stops after applying firm pressure to the area.  Your puncture site feels warm to the touch.  You have pus or a bad smell coming from your puncture site.  You have a fever.  You have chest pain or discomfort that spreads to your neck, jaw, or arm.  You are sweating a lot.  You feel nauseous.  You have a fast or irregular heartbeat.  You have shortness of breath.  You are dizzy or light-headed and feel the need to lie down.  You have pain or numbness in the arm or leg closest to your puncture site. Get help right away if:  Your puncture site suddenly swells.  Your puncture site is bleeding and the bleeding does not stop after applying firm   pressure to the area. These symptoms may represent a serious problem that is an emergency. Do not wait to see if the symptoms will go away. Get medical help right away. Call your local emergency services (911 in the [place] Do not drive yourself to the hospital. Summary  After the procedure, it is normal to have bruising and tenderness at the puncture site in your groin, neck, or forearm.  Check your puncture site every day for signs of infection.  Get help right away if your puncture site is bleeding and the bleeding does not stop after applying firm  pressure to the area. This is a medical emergency. This information is not intended to replace advice given to you by your health care provider. Make sure you discuss any questions you have with your health care provider.    

## 2020-09-08 NOTE — Progress Notes (Signed)
Site Area:  RIJ x 1, RFV x 3 Site prior to removal: Level 0 Pressure applied for:  20 minutes Manual:  yes Pt status during pull:  stable Post pull site:  Level 0 Post pull instructions given:  yes Post pull pulses present:  DP Dressing applied:  Gauze/tegaderm to RFV, vasoline gauze/gauze/tegaderm RIJ Bedrest begins @:  10:30 Comments: Removed by Billey Co RN

## 2020-09-09 ENCOUNTER — Encounter (HOSPITAL_COMMUNITY): Payer: Self-pay | Admitting: Internal Medicine

## 2020-09-09 MED FILL — Bupivacaine HCl Preservative Free (PF) Inj 0.25%: INTRAMUSCULAR | Qty: 60 | Status: AC

## 2020-09-12 ENCOUNTER — Telehealth: Payer: Self-pay

## 2020-09-12 MED ORDER — OMEPRAZOLE 20 MG PO CPDR: 20.0000 mg | DELAYED_RELEASE_CAPSULE | Freq: Two times a day (BID) | ORAL | 0 refills | Status: DC

## 2020-09-12 NOTE — Telephone Encounter (Signed)
See mychart message.  Per [facility] order prilosec 20 mg bid x 5 days  Sent to pharmacy  Pt aware

## 2020-09-12 NOTE — Addendum Note (Signed)
Addended by: Roney Mans A on: 09/12/2020 12:56 PM   Modules accepted: Orders

## 2020-09-12 NOTE — Telephone Encounter (Signed)
We received a fax stating that her Insurance would not cover Omeprazole. This was prescribed for her to take for 5 days after her ablation. I called pt to advise her that she could purchase this OTC and she stated that she had already bought it and started taking it. She was appreciative for the call.

## 2020-10-06 ENCOUNTER — Other Ambulatory Visit: Payer: Self-pay

## 2020-10-06 ENCOUNTER — Ambulatory Visit (INDEPENDENT_AMBULATORY_CARE_PROVIDER_SITE_OTHER): Payer: BC Managed Care – PPO | Admitting: Internal Medicine

## 2020-10-06 ENCOUNTER — Encounter: Payer: Self-pay | Admitting: Internal Medicine

## 2020-10-06 VITALS — BP 112/72 | HR 86 | Ht 66.0 in | Wt 220.8 lb

## 2020-10-06 DIAGNOSIS — I471 Supraventricular tachycardia: Secondary | ICD-10-CM | POA: Diagnosis not present

## 2020-10-06 NOTE — Progress Notes (Signed)
HPI Dorothy Aguilar returns for followup of SVT s/p catheter ablation. 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 She had mild pleuritic chest pain for a few days after the procedure. She has rare and brief palpitations lasting a couple of seconds. She has gone back to strenuous activity. She went snorkeling on an 8 day cruise without difficulty.  Allergies  Allergen Reactions  . Aspirin Anaphylaxis and Other (See Comments)    Lips and mouth swelling  . Codeine Swelling  . Motrin [Ibuprofen]   . Other     Nuts, mouth and lips swell, apples and cherries mouth swells  . Penicillins Swelling  . Shellfish Allergy   . Sulfonamide Derivatives Swelling     Current Outpatient Medications  Medication Sig Dispense Refill  . diphenhydrAMINE (BENADRYL) 25 mg capsule Take 25 mg by mouth every 6 (six) hours as needed for allergies.    Marland Kitchen EPINEPHrine 0.3 mg/0.3 mL IJ SOAJ injection Inject 0.3 mg into the muscle as needed for anaphylaxis.    . fexofenadine (ALLEGRA) 180 MG tablet Take 180 mg by mouth daily as needed for allergies.    Marland Kitchen omeprazole (PRILOSEC) 20 MG capsule Take 1 capsule (20 mg total) by mouth 2 (two) times daily before a meal for 5 days. 10 capsule 0   No current facility-administered medications for this visit.     Past Medical History:  Diagnosis Date  . Depression    mild pp depression  . GERD (gastroesophageal reflux disease)   . Hx of varicella   . Palpitations     ROS:   All systems reviewed and negative except as noted in the HPI.   Past Surgical History:  Procedure Laterality Date  . SHOULDER BIOPSY  11/2007  . SVT ABLATION N/A 09/08/2020   Procedure: SVT ABLATION;  Surgeon: Marinus Maw, MD;  [facility] INVASIVE CV LAB;  Service: Cardiovascular;  Laterality: N/A;  . WISDOM TOOTH EXTRACTION       Family History  Problem Relation Age of Onset  . Hypertension Mother   . Thyroid disease Mother   . Osteoporosis Mother   . Allergic  rhinitis Mother   . Food Allergy Mother   . Hypertension Maternal Grandmother   . Thyroid disease Maternal Grandmother   . Osteoporosis Maternal Grandmother   . Hypertension Maternal Grandfather   . Osteoporosis Paternal Grandmother   . Cancer Other   . Angioedema Neg Hx   . Asthma Neg Hx   . Eczema Neg Hx   . Immunodeficiency Neg Hx   . Urticaria Neg Hx      Social History   Socioeconomic History  . Marital status: Married    Spouse name: Not on file  . Number of children: Not on file  . Years of education: Not on file  . Highest education level: Not on file  Occupational History  . Not on file  Tobacco Use  . Smoking status: Never Smoker  . Smokeless tobacco: Never Used  Substance and Sexual Activity  . Alcohol use: Yes  . Drug use: No  . Sexual activity: Yes  Other Topics Concern  . Not on file  Social History Narrative  . Not on file   Social Determinants of Health   Financial Resource Strain: Not on file  Food Insecurity: Not on file  Transportation Needs: Not on file  Physical Activity: Not on file  Stress: Not on file  Social Connections: Not on file  Intimate Partner Violence: Not on file     BP 112/72   Pulse 86   Ht 5\' 6"  (1.676 m)   Wt 220 lb 12.8 oz (100.2 kg)   LMP  (LMP Unknown)   SpO2 98%   BMI 35.64 kg/m   Physical Exam:  Well appearing NAD HEENT: Unremarkable Neck:  No JVD, no thyromegally Lymphatics:  No adenopathy Back:  No CVA tenderness Lungs:  Clear with no wheezes HEART:  Regular rate rhythm, no murmurs, no rubs, no clicks Abd:  soft, positive bowel sounds, no organomegally, no rebound, no guarding Ext:  2 plus pulses, no edema, no cyanosis, no clubbing Skin:  No rashes no nodules Neuro:  CN II through XII intact, motor grossly intact  EKG - nsr   Assess/Plan: 1. SVT - she is doing well s/p catheter ablation. She will undergo watchful waiting.  2. Obesity - we discussed the importance of weight loss and I reviewed  the rationale for intermittent fasting.  Lafreda Casebeer,MD

## 2020-10-06 NOTE — Patient Instructions (Signed)
Medication Instructions:  Your physician recommends that you continue on your current medications as directed. Please refer to the Current Medication list given to you today.  Labwork: None ordered.  Testing/Procedures: None ordered.  Follow-Up: Your physician wants you to follow-up in: as needed with Gregg Taylor, MD    Any Other Special Instructions Will Be Listed Below (If Applicable).  If you need a refill on your cardiac medications before your next appointment, please call your pharmacy.       

## 2021-02-03 IMAGING — DX CHEST - 2 VIEW
2 series · 2 of 2 positions shown · non-contrast
Comparison: None.

CLINICAL DATA: Supraventricular tachycardia

EXAM:
CHEST - 2 VIEW

[chest pa]
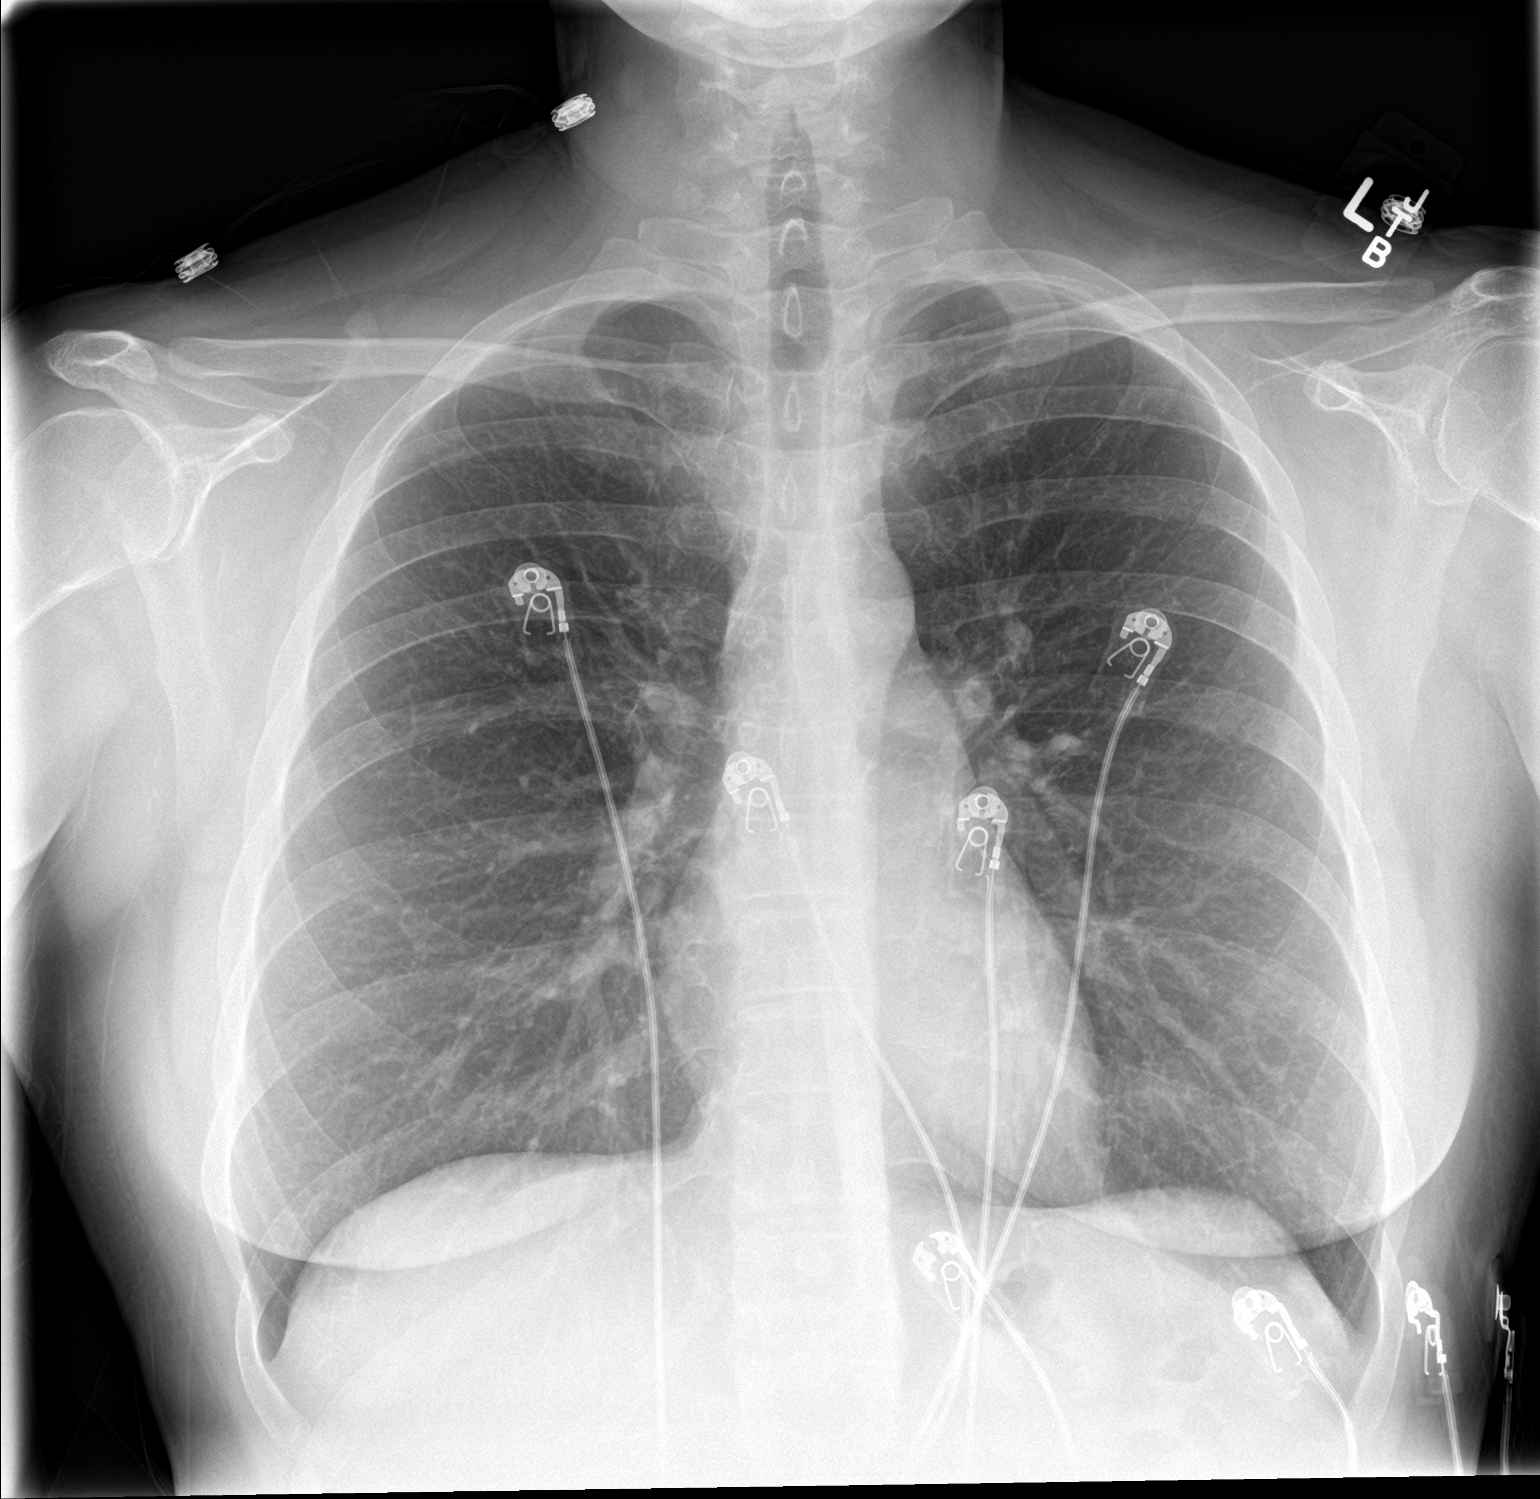

[chest lat]
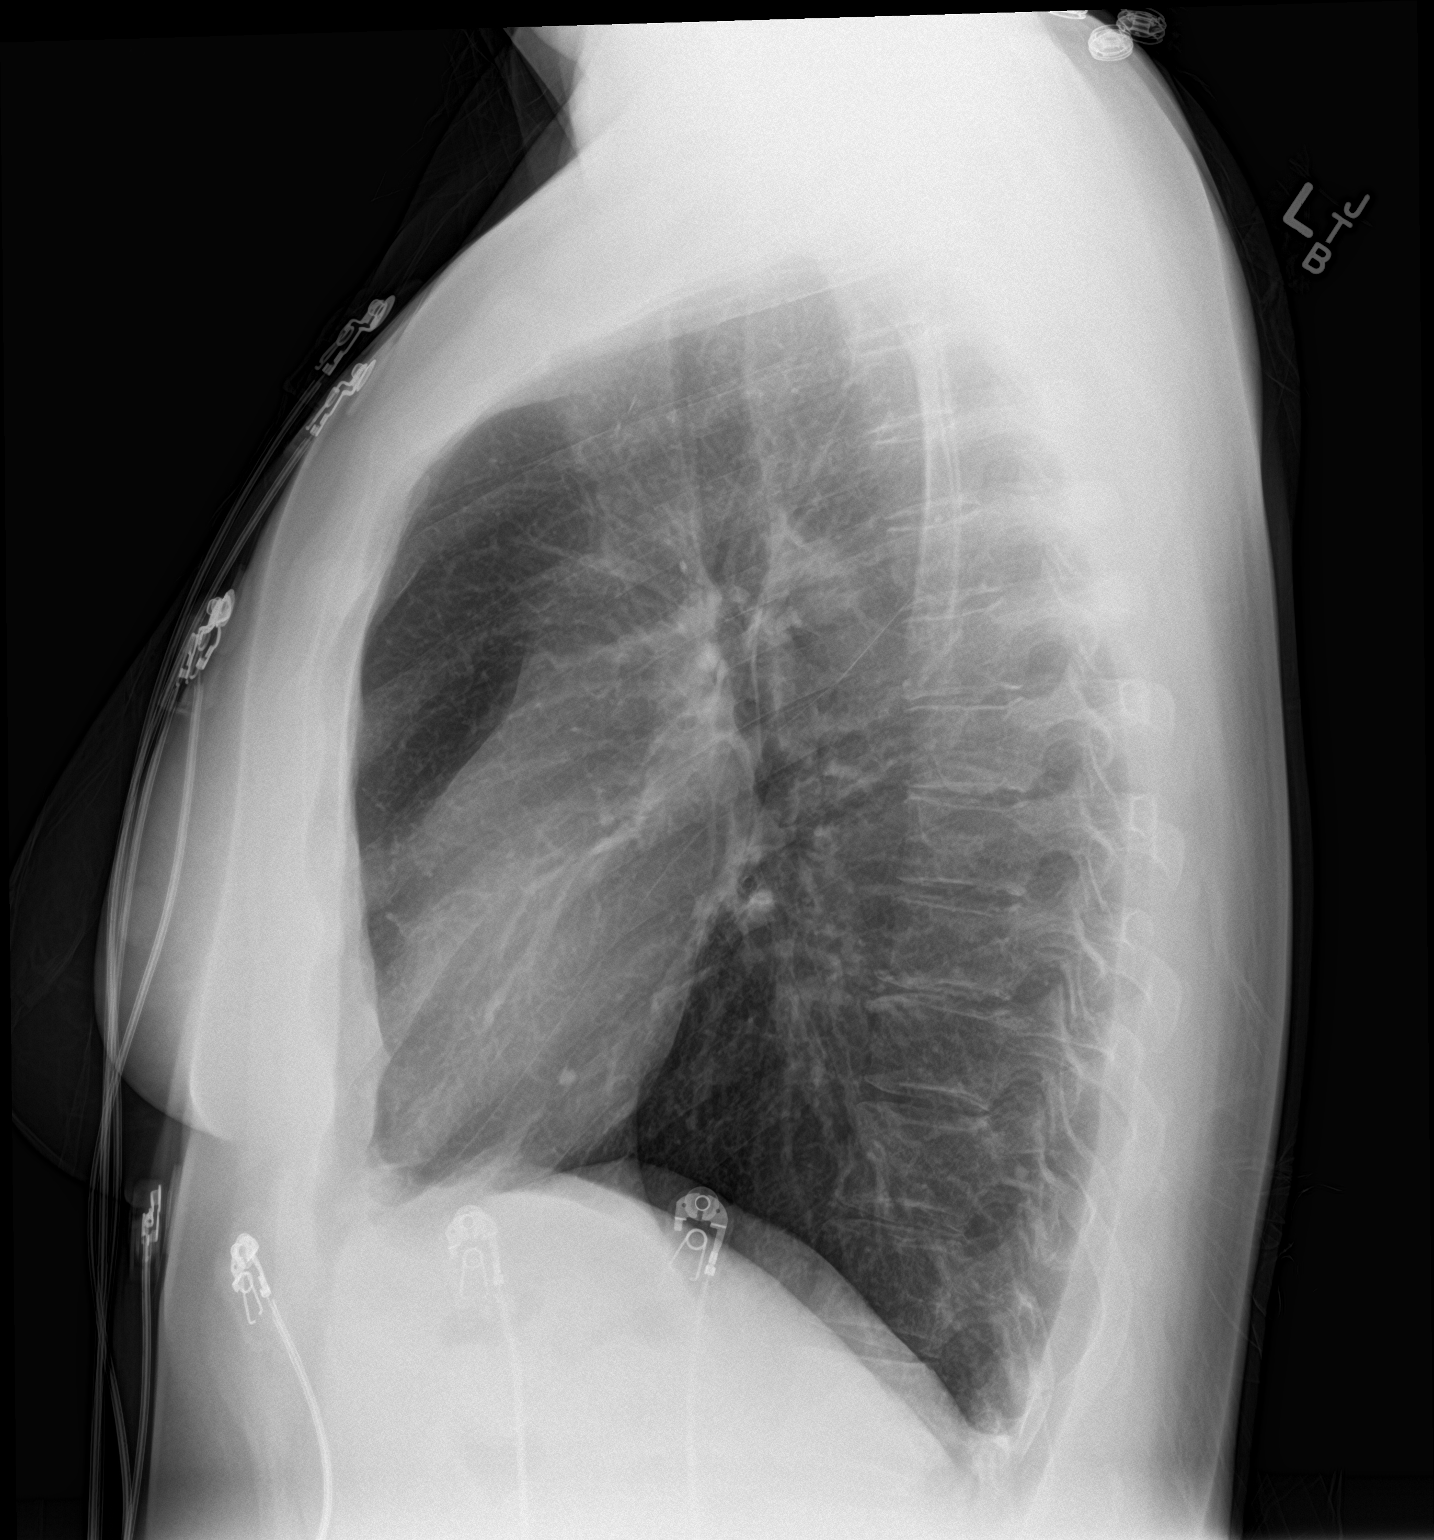

[2 of 2 positions shown; findings below may reference images not displayed]

FINDINGS: The heart size and mediastinal contours are within normal limits.
Both lungs are clear. The visualized skeletal structures are
unremarkable.
IMPRESSION: No active cardiopulmonary disease.

## 2021-02-05 NOTE — Progress Notes (Signed)
Cardiology Office Note:    Date:  02/08/2021   ID:  Dorothy Aguilar, DOB 11/27/1980, MRN [id number]  PCP:  Juanita Laster, MD  Cardiologist:  None  Electrophysiologist:  None   Referring MD: Juanita Laster, MD   No chief complaint on file.   History of Present Illness:    Dorothy Aguilar is a 40 y.o. female with a hx of SVT who presents for follow-up.   She underwent SVT ablation with Dr. Ladona Ridgel on 09/08/2020.  Echocardiogram 08/26/2020 showed normal biventricular function, no significant valvular disease.    Since last clinic visit, she reports that she has been doing well.  She has had about 3 episodes of palpitations over the last few months, have lasted for few seconds and resolved. Denies any chest pain, dyspnea, lightheadedness, syncope, lower extremity edema.  She has started working with a Psychologist, educational, has been lifting weights and walking.  She lifts weights on Tuesdays and Thursdays and walks on Monday Wednesday Friday for 30 minutes.  Denies any exertional symptoms.    Past Medical History:  Diagnosis Date   Depression    mild pp depression   GERD (gastroesophageal reflux disease)    Hx of varicella    Palpitations     Past Surgical History:  Procedure Laterality Date   SHOULDER BIOPSY  11/2007   SVT ABLATION N/A 09/08/2020   Procedure: SVT ABLATION;  Surgeon: Marinus Maw, MD;  Location: [facility] INVASIVE CV LAB;  Service: Cardiovascular;  Laterality: N/A;   WISDOM TOOTH EXTRACTION      Current Medications: Current Meds  Medication Sig   diphenhydrAMINE (BENADRYL) 25 mg capsule Take 25 mg by mouth every 6 (six) hours as needed for allergies.   EPINEPHrine 0.3 mg/0.3 mL IJ SOAJ injection Inject 0.3 mg into the muscle as needed for anaphylaxis.   fexofenadine (ALLEGRA) 180 MG tablet Take 180 mg by mouth daily as needed for allergies.   omeprazole (PRILOSEC) 20 MG capsule Take 1 capsule (20 mg total) by mouth 2 (two) times daily before a meal for 5 days.     Allergies:    Aspirin, Codeine, Motrin [ibuprofen], Other, Penicillins, Shellfish allergy, and Sulfonamide derivatives   Social History   Socioeconomic History   Marital status: Married    Spouse name: Not on file   Number of children: Not on file   Years of education: Not on file   Highest education level: Not on file  Occupational History   Not on file  Tobacco Use   Smoking status: Never   Smokeless tobacco: Never  Substance and Sexual Activity   Alcohol use: Yes   Drug use: No   Sexual activity: Yes  Other Topics Concern   Not on file  Social History Narrative   Not on file   Social Determinants of Health   Financial Resource Strain: Not on file  Food Insecurity: Not on file  Transportation Needs: Not on file  Physical Activity: Not on file  Stress: Not on file  Social Connections: Not on file     Family History: The patient's family history includes Allergic rhinitis in her mother; Cancer in an other family member; Food Allergy in her mother; Hypertension in her maternal grandfather, maternal grandmother, and mother; Osteoporosis in her maternal grandmother, mother, and paternal grandmother; Thyroid disease in her maternal grandmother and mother. There is no history of Angioedema, Asthma, Eczema, Immunodeficiency, or Urticaria.  ROS:   Please see the history of present illness.  All other systems reviewed and are negative.  EKGs/Labs/Other Studies Reviewed:    The following studies were reviewed today:   EKG:  EKG is not ordered today.  The last ekg ordered demonstrates normal sinus rhythm, right axis deviation, rate 80, no ST/T abnormalities  EKG from EMS run sheet 01/02/19: SVT, rate 200    Recent Labs: 08/16/2020: BUN 15; Creatinine, Ser 0.85; Hemoglobin 14.3; Platelets 301; Potassium 4.7; Sodium 140  Recent Lipid Panel No results found for: CHOL, TRIG, HDL, CHOLHDL, VLDL, LDLCALC, LDLDIRECT   Cardiac monitor 06/03/19: Episode of likely SVT with HR 150 bpm at 2AM  on 12/18; there is significant artifact during episode. Unclear duration, next recording at 5AM appears to show sinus tachycardia.   Predominant rhythm is sinus rhythm. Range is 60 to 166 bpm with average of 83 bpm. No atrial fibrillation, sustained ventricular tachycardia, significant pause, or high degree AV block. Total ectopy <1%. 3 patient triggered events, which corresponded to sinus rhythm.  Episode of likely SVT with HR 150 bpm at 2AM on 12/18; there is significant artifact during this episode.  Unclear duration, next recording at 5AM appears to show sinus tachycardia.  Physical Exam:    VS:  BP 122/70 (BP Location: Right Arm, Patient Position: Sitting, Cuff Size: Normal)   Pulse 78   Ht 5\' 6"  (1.676 m)   Wt 223 lb 6.4 oz (101.3 kg)   SpO2 97%   BMI 36.06 kg/m     Wt Readings from Last 3 Encounters:  02/08/21 223 lb 6.4 oz (101.3 kg)  10/06/20 220 lb 12.8 oz (100.2 kg)  09/08/20 218 lb (98.9 kg)     GEN:  Well nourished, well developed in no acute distress HEENT: Normal NECK: No JVD CARDIAC: RRR, no murmurs, rubs, gallops RESPIRATORY:  Clear to auscultation without rales, wheezing or rhonchi  ABDOMEN: Soft, non-tender, non-distended MUSCULOSKELETAL:  No edema; No deformity  SKIN: Warm and dry NEUROLOGIC:  Alert and oriented x 3 PSYCHIATRIC:  Normal affect   ASSESSMENT:    1. SVT (supraventricular tachycardia) (HCC)     PLAN:    In order of problems listed above:  SVT: Infrequent episodes, but episode in July 2020 required adenosine administration by EMS to break.  Cardiac monitor showed another episode in December 2020 that occurred after drinking 3 glasses of wine.  Had episode in December 2021 after drinking glass of wine.   She underwent SVT ablation with Dr. January 2022 on 09/08/2020.  Symptoms have significantly improved.  Echocardiogram 08/26/2020 showed normal biventricular function, no significant valvular disease.     RTC in 1 year   Medication Adjustments/Labs  and Tests Ordered: Current medicines are reviewed at length with the patient today.  Concerns regarding medicines are outlined above.  No orders of the defined types were placed in this encounter.  No orders of the defined types were placed in this encounter.   Patient Instructions  Medication Instructions:  Your physician recommends that you continue on your current medications as directed. Please refer to the Current Medication list given to you today.  *If you need a refill on your cardiac medications before your next appointment, please call your pharmacy*  Follow-Up: At [facility], you and your health needs are our priority.  As part of our continuing mission to provide you with exceptional heart care, we have created designated Provider Care Teams.  These Care Teams include your primary Cardiologist (physician) and Advanced Practice Providers (APPs -  Physician Assistants and Nurse Practitioners)  who all work together to provide you with the care you need, when you need it.  We recommend signing up for the patient portal called "MyChart".  Sign up information is provided on this After Visit Summary.  MyChart is used to connect with patients for Virtual Visits (Telemedicine).  Patients are able to view lab/test results, encounter notes, upcoming appointments, etc.  Non-urgent messages can be sent to your provider as well.   To learn more about what you can do with MyChart, go to [email or web address].    Your next appointment:   12 month(s)  The format for your next appointment:   In Person  Provider:   Epifanio Lesches, MD      Signed, Little Ishikawa, MD  02/08/2021 3:53 PM    [facility]

## 2021-02-08 ENCOUNTER — Other Ambulatory Visit: Payer: Self-pay

## 2021-02-08 ENCOUNTER — Encounter: Payer: Self-pay | Admitting: Cardiology

## 2021-02-08 ENCOUNTER — Ambulatory Visit: Payer: Managed Care, Other (non HMO) | Admitting: Cardiology

## 2021-02-08 VITALS — BP 122/70 | HR 78 | Ht 66.0 in | Wt 223.4 lb

## 2021-02-08 DIAGNOSIS — I471 Supraventricular tachycardia: Secondary | ICD-10-CM

## 2021-02-08 NOTE — Patient Instructions (Signed)

## 2021-04-26 ENCOUNTER — Other Ambulatory Visit: Payer: Self-pay | Admitting: Obstetrics and Gynecology

## 2021-04-26 DIAGNOSIS — R928 Other abnormal and inconclusive findings on diagnostic imaging of breast: Secondary | ICD-10-CM

## 2021-05-16 ENCOUNTER — Other Ambulatory Visit: Payer: Managed Care, Other (non HMO)

## 2022-01-17 NOTE — Telephone Encounter (Signed)
Closing encounter

## 2022-04-02 ENCOUNTER — Ambulatory Visit: Payer: Managed Care, Other (non HMO) | Admitting: Family Medicine

## 2022-04-16 ENCOUNTER — Encounter: Payer: Self-pay | Admitting: Family Medicine

## 2022-04-16 ENCOUNTER — Ambulatory Visit: Payer: Managed Care, Other (non HMO) | Admitting: Family Medicine

## 2022-04-16 VITALS — BP 126/82 | HR 78 | Temp 97.0°F | Ht 65.5 in | Wt 243.2 lb

## 2022-04-16 DIAGNOSIS — F419 Anxiety disorder, unspecified: Secondary | ICD-10-CM | POA: Diagnosis not present

## 2022-04-16 DIAGNOSIS — R635 Abnormal weight gain: Secondary | ICD-10-CM

## 2022-04-16 DIAGNOSIS — R600 Localized edema: Secondary | ICD-10-CM | POA: Diagnosis not present

## 2022-04-16 DIAGNOSIS — Z91013 Allergy to seafood: Secondary | ICD-10-CM

## 2022-04-16 DIAGNOSIS — R232 Flushing: Secondary | ICD-10-CM

## 2022-04-16 DIAGNOSIS — S29011A Strain of muscle and tendon of front wall of thorax, initial encounter: Secondary | ICD-10-CM

## 2022-04-16 MED ORDER — EPINEPHRINE 0.3 MG/0.3ML IJ SOAJ: 0.3000 mg | INTRAMUSCULAR | 3 refills | Status: AC | PRN

## 2022-04-16 NOTE — Assessment & Plan Note (Signed)
Likely venous stasis given obesity, however long car ride with new onset swelling cannot rule out DVT Refer to get lower extremity Dopplers ED precautions discussed

## 2022-04-16 NOTE — Progress Notes (Signed)
Assessment/Plan:  At today's visit, we discussed treatment options, associated risk and benefits, and engage in counseling as needed.  Additionally the following were reviewed: Past medical records, past medical and surgical history, family and social background, as well as relevant laboratory results, imaging findings, and specialty notes, where applicable.  This message was generated using dictation software, and as a result, it may contain unintentional typos or errors.  Nevertheless, extensive effort was made to accurately convey at the pertinent aspects of the patient visit.    There may have been are other unrelated non-urgent complaints, but due to the busy schedule and the amount of time already spent with her, time does not permit to address these issues at today's visit. Another appointment may have or has been requested to review these additional issues.  Problem List Items Addressed This Visit       Musculoskeletal and Integument   Pectoralis muscle strain    Left upper chest pain located under her clavicle, her left arm is her is that her dominant driving arm and this does make it worse Recommend OTC Tylenol and basic rehab stretches If no improvement, patient follow-up Patient has allergy to NSAIDs        Other   Anxiety - Primary    Associated with elevated alcohol use and eating a weight gain Counseled on proper alcohol use and recommended reduction Given significant weight gain, recommend against pursuing medication for treatment at this time as that is a very common side effect Referral to behavioral health placed Patient follow-up few months for physical      Relevant Orders   Ambulatory referral to Behavioral Health   Bilateral lower extremity edema    Likely venous stasis given obesity, however long car ride with new onset swelling cannot rule out DVT Refer to get lower extremity Dopplers ED precautions discussed      Relevant Orders   VAS [place] LOWER  EXTREMITY VENOUS (DVT)   Other Visit Diagnoses     Weight gain       Shellfish allergy       Relevant Medications   EPINEPHrine 0.3 mg/0.3 mL IJ SOAJ injection   Hot flashes       Relevant Medications   EPINEPHrine 0.3 mg/0.3 mL IJ SOAJ injection          Subjective:  HPI:  Dorothy Aguilar is a 41 y.o. female who has HYPERLIPIDEMIA, FAMILIAL; TACHYCARDIA; PALPITATIONS; RECTAL BLEEDING, HX OF; Food allergy; Perennial and seasonal allergic rhinitis; History of urticaria/angioedema; Keratosis pilaris; SVT (supraventricular tachycardia); Anxiety; Bilateral lower extremity edema; and Pectoralis muscle strain on their problem list..   She  has a past medical history of Depression, GERD (gastroesophageal reflux disease), varicella, and Palpitations..   She presents with chief complaint of Establish Care (Hot flashes x 2 weeks. Gained 80 pounds this year as well. Would like to talk about weight management.Rx refill epi pen) .   Patient is here to establish care.  Patient concerns including anxiety and depression, hot flashes, and bilateral ankle swelling  Hot flashes.  Patient states that she has had hot flashes intermittently over the past 2 weeks.  She does not have a history of these.  Patient is not endorsing fevers or chills.  Depression/Anxiety,  Patient dates that she had a divorce 7 years ago.  She has since remarried.  But over the past year, she has had split custody of her children.  Since then she has had difficulty with anxiety and  overeating.  Reports that she drinks approximately 3 glasses of wine a day.  She also then overeats at night.  He has no history of anxiety or depression.  She has been in counseling in the past during the divorce for family counseling.  Regarding weight gain.  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  Over the past year she says that she has gained approximately 80 pounds.  Patient denies any other  substance use, or smoking.      04/16/2022    1:55 PM  Depression screen PHQ 2/9  Decreased Interest 0  Down, Depressed, Hopeless 0  PHQ - 2 Score 0  Altered sleeping 1  Tired, decreased energy 0  Change in appetite 3  Feeling bad or failure about yourself  0  Trouble concentrating 0  Moving slowly or fidgety/restless 0  Suicidal thoughts 0  PHQ-9 Score 4  Difficult doing work/chores Very difficult       04/16/2022    1:56 PM  GAD 7 : Generalized Anxiety Score  Nervous, Anxious, on Edge 3  Control/stop worrying 3  Worry too much - different things 3  Trouble relaxing 3  Restless 3  Easily annoyed or irritable 3  Afraid - awful might happen 3  Total GAD 7 Score 21  Anxiety Difficulty Extremely difficult   Flowsheet Row Office Visit from 04/16/2022 in [facility]  Alcohol Use Disorder Identification Test Final Score (AUDIT) 13       ROS: No SI or HI.  Ankle swelling.  Patient reports that she developed ankle swelling over the past few days.  She was riding back in a car from [place] where her symptoms playing baseball.  She spent several hours in the vehicle.  This is a new issue.  She denies shortness of breath.  Her goals are not painful.  Discussed potential for pulm embolism and need to go to ED if develop significant chest pain or shortness of breath.  Clavicular pain.  Patient has left upper chest versus clavicle pain.  It is midway through the shaft.  Denies any injury.  It is worse when she is driving.  Patient does drive a lot for her job.  It is intermittent.  Is not worsening.  Patient does rub it and this can improve it, but however recently it has continued to be sore.  Shellfish allergy.  Patient with shellfish allergy bleeding anaphylaxis.  Patient needs refill on EpiPen.    Past Surgical History:  Procedure Laterality Date   SHOULDER BIOPSY  11/2007   SVT ABLATION N/A 09/08/2020   Procedure: SVT ABLATION;  Surgeon: Marinus Maw, MD;  Location: [facility] INVASIVE CV LAB;  Service: Cardiovascular;  Laterality: N/A;   WISDOM TOOTH EXTRACTION      Outpatient Medications Prior to Visit  Medication Sig Dispense Refill   diphenhydrAMINE (BENADRYL) 25 mg capsule Take 25 mg by mouth every 6 (six) hours as needed for allergies.     fexofenadine (ALLEGRA) 180 MG tablet Take 180 mg by mouth daily as needed for allergies.     EPINEPHrine 0.3 mg/0.3 mL IJ SOAJ injection Inject 0.3 mg into the muscle as needed for anaphylaxis.     omeprazole (PRILOSEC) 20 MG capsule Take 1 capsule (20 mg total) by mouth 2 (two) times daily before a meal for 5 days. 10 capsule 0   No facility-administered medications prior to visit.    Family History  Problem  Relation Age of Onset   Hypertension Mother    Thyroid disease Mother    Osteoporosis Mother    Allergic rhinitis Mother    Food Allergy Mother    Hypertension Maternal Grandmother    Thyroid disease Maternal Grandmother    Osteoporosis Maternal Grandmother    Hypertension Maternal Grandfather    Osteoporosis Paternal Grandmother    Cancer Other    Angioedema Neg Hx    Asthma Neg Hx    Eczema Neg Hx    Immunodeficiency Neg Hx    Urticaria Neg Hx     Social History   Socioeconomic History   Marital status: Married    Spouse name: Not on file   Number of children: Not on file   Years of education: Not on file   Highest education level: Not on file  Occupational History   Not on file  Tobacco Use   Smoking status: Never    Passive exposure: Never   Smokeless tobacco: Never  Vaping Use   Vaping Use: Never used  Substance and Sexual Activity   Alcohol use: Yes    Alcohol/week: 14.0 standard drinks of alcohol    Types: 14 Glasses of wine per week   Drug use: No   Sexual activity: Yes  Other Topics Concern   Not on file  Social History Narrative   Not on file   Social Determinants of Health   Financial Resource Strain: Not on file  Food Insecurity: Not on file   Transportation Needs: Not on file  Physical Activity: Not on file  Stress: Not on file  Social Connections: Not on file  Intimate Partner Violence: Not on file                                                                                                 Objective:  Physical Exam: BP 126/82 (BP Location: Left Arm, Patient Position: Sitting, Cuff Size: Large)   Pulse 78   Temp (!) 97 F (36.1 C) (Temporal)   Ht 5' 5.5" (1.664 m)   Wt 243 lb 3.2 oz (110.3 kg)   LMP  (LMP Unknown)   SpO2 98%   BMI 39.86 kg/m    General: No acute distress. Awake and conversant.  Eyes: Normal conjunctiva, anicteric. Round symmetric pupils.  ENT: Hearing grossly intact. No nasal discharge.  Neck: Neck is supple. No masses or thyromegaly.  Respiratory: Respirations are non-labored. No auditory wheezing.  CTA B Skin: Warm. No rashes or ulcers.  Psych: Alert and oriented. Cooperative, Appropriate mood and affect, Normal judgment.  CV: No cyanosis or JVD, RRR, no MRG, ABD: Nontender nondistended MSK: Normal ambulation.  2+ pitting edema bilaterally to mid shin, left upper chest wall under mid clavicle tender Neuro: Sensation and CN II-XII grossly normal.        Garner Nash, MD, MS

## 2022-04-16 NOTE — Assessment & Plan Note (Signed)
Associated with elevated alcohol use and eating a weight gain Counseled on proper alcohol use and recommended reduction Given significant weight gain, recommend against pursuing medication for treatment at this time as that is a very common side effect Referral to behavioral health placed Patient follow-up few months for physical

## 2022-04-16 NOTE — Assessment & Plan Note (Signed)
Left upper chest pain located under her clavicle, her left arm is her is that her dominant driving arm and this does make it worse Recommend OTC Tylenol and basic rehab stretches If no improvement, patient follow-up Patient has allergy to NSAIDs

## 2022-04-17 ENCOUNTER — Encounter: Payer: Self-pay | Admitting: Family Medicine

## 2022-05-30 ENCOUNTER — Telehealth: Payer: Self-pay

## 2022-05-30 NOTE — Telephone Encounter (Signed)
Received an incoming fax from [facility] for rx refill on epinephrine 0.3 mg inj 2 pack. Patient was seen by Dr. Janee Morn on 04/16/2022 as a new patient and Dr. Edmon Crape on 05/18/2022 for physical. I advised patient that she would have to reach out to their office for refills since she is unable to have two PCP's. Patient verbalized understanding.

## 2023-01-14 ENCOUNTER — Ambulatory Visit: Payer: Managed Care, Other (non HMO) | Admitting: Gastroenterology

## 2023-07-12 ENCOUNTER — Ambulatory Visit
Admission: RE | Admit: 2023-07-12 | Discharge: 2023-07-12 | Disposition: A | Discharge status: Home / Self Care | Payer: BC Managed Care – PPO | Source: Ambulatory Visit | Attending: Family Medicine

## 2023-07-12 ENCOUNTER — Other Ambulatory Visit: Payer: Self-pay

## 2023-07-12 VITALS — BP 112/79 | HR 84 | Temp 98.3°F | Resp 18 | Ht 65.0 in | Wt 230.0 lb

## 2023-07-12 DIAGNOSIS — J029 Acute pharyngitis, unspecified: Secondary | ICD-10-CM

## 2023-07-12 DIAGNOSIS — B349 Viral infection, unspecified: Secondary | ICD-10-CM | POA: Diagnosis not present

## 2023-07-12 LAB — POC COVID19/FLU A&B COMBO
Covid Antigen, POC: NEGATIVE
Influenza A Antigen, POC: NEGATIVE
Influenza B Antigen, POC: NEGATIVE

## 2023-07-12 LAB — POCT RAPID STREP A (OFFICE): Rapid Strep A Screen: NEGATIVE

## 2023-07-12 NOTE — ED Triage Notes (Signed)
 Pt presents with complaints of sore throat x 5 days. Pt is taking OTC Claritin  and Tylenol . Pt is requesting to be swabbed for Flu and strep. Two of pt's family members tested + for Flu A two days ago.

## 2023-07-12 NOTE — ED Provider Notes (Signed)
 [facility]    CSN: [id number] Arrival date & time: 07/12/23  9047      History   Chief Complaint Chief Complaint  Patient presents with   Sore Throat    HPI Dorothy Aguilar is a 43 y.o. female.   The history is provided by the patient.  Sore Throat Associated symptoms include headaches.  Has been sick for 6 days symptoms include nasal congestion, rhinorrhea, frontal headache, cough, sore throat.  2 of her children diagnosed with influenza this week.  She is concerned about strep.  Denies fever, body aches, fatigue, swollen glands, difficulty swallowing, change in voice.  Past Medical History:  Diagnosis Date   Depression    mild pp depression   GERD (gastroesophageal reflux disease)    Hx of varicella    Palpitations     Patient Active Problem List   Diagnosis Date Noted   Anxiety 04/16/2022   Bilateral lower extremity edema 04/16/2022   Pectoralis muscle strain 04/16/2022   SVT (supraventricular tachycardia) (HCC) 08/16/2020   Food allergy 03/15/2016   Perennial and seasonal allergic rhinitis 03/15/2016   History of urticaria/angioedema 03/15/2016   Keratosis pilaris 03/15/2016   PALPITATIONS 09/01/2009   HYPERLIPIDEMIA, FAMILIAL 08/31/2009   TACHYCARDIA 08/30/2009   RECTAL BLEEDING, HX OF 08/30/2009    Past Surgical History:  Procedure Laterality Date   SHOULDER BIOPSY  11/2007   SVT ABLATION N/A 09/08/2020   Procedure: SVT ABLATION;  Surgeon: Waddell Danelle ORN, MD;  Location: [facility] INVASIVE CV LAB;  Service: Cardiovascular;  Laterality: N/A;   WISDOM TOOTH EXTRACTION      OB History     Gravida  2   Para  2   Term  2   Preterm      AB      Living  2      SAB      IAB      Ectopic      Multiple      Live Births  2            Home Medications    Prior to Admission medications   Medication Sig Start Date End Date Taking? Authorizing Provider  diphenhydrAMINE  (BENADRYL ) 25 mg capsule Take 25 mg by mouth every 6 (six)  hours as needed for allergies.    [provider]  EPINEPHrine  0.3 mg/0.3 mL IJ SOAJ injection Inject 0.3 mg into the muscle as needed for anaphylaxis. 04/16/22   Sebastian Beverley NOVAK, MD  fexofenadine (ALLEGRA) 180 MG tablet Take 180 mg by mouth daily as needed for allergies. 12/16/18   [provider]  omeprazole  (PRILOSEC) 20 MG capsule Take 1 capsule (20 mg total) by mouth 2 (two) times daily before a meal for 5 days. 09/12/20 02/08/21  Waddell Danelle ORN, MD    Family History Family History  Problem Relation Age of Onset   Hypertension Mother    Thyroid  disease Mother    Osteoporosis Mother    Allergic rhinitis Mother    Food Allergy Mother    Hypertension Maternal Grandmother    Thyroid  disease Maternal Grandmother    Osteoporosis Maternal Grandmother    Hypertension Maternal Grandfather    Osteoporosis Paternal Grandmother    Cancer Other    Angioedema Neg Hx    Asthma Neg Hx    Eczema Neg Hx    Immunodeficiency Neg Hx    Urticaria Neg Hx     Social History Social History   Tobacco Use  Smoking status: Never    Passive exposure: Never   Smokeless tobacco: Never  Vaping Use   Vaping status: Never Used  Substance Use Topics   Alcohol use: Yes    Alcohol/week: 14.0 standard drinks of alcohol    Types: 14 Glasses of wine per week   Drug use: No     Allergies   Almond (diagnostic), Aspirin, Codeine, Motrin [ibuprofen], Other, Penicillins, Shellfish allergy, and Sulfonamide derivatives   Review of Systems Review of Systems  Constitutional:  Negative for fatigue and fever.  HENT:  Positive for congestion, rhinorrhea and sore throat.   Respiratory:  Positive for cough.   Gastrointestinal:  Negative for diarrhea, nausea and vomiting.  Musculoskeletal:  Negative for myalgias.  Skin:  Negative for rash.  Neurological:  Positive for headaches.     Physical Exam Triage Vital Signs ED Triage Vitals  Encounter Vitals Group     BP 07/12/23 1020 112/79      Systolic BP Percentile --      Diastolic BP Percentile --      Pulse Rate 07/12/23 1020 84     Resp 07/12/23 1020 18     Temp 07/12/23 1020 98.3 F (36.8 C)     Temp Source 07/12/23 1020 Oral     SpO2 07/12/23 1020 96 %     Weight 07/12/23 1022 230 lb (104.3 kg)     Height 07/12/23 1022 5' 5 (1.651 m)     Head Circumference --      Peak Flow --      Pain Score 07/12/23 1021 8     Pain Loc --      Pain Education --      Exclude from Growth Chart --    No data found.  Updated Vital Signs BP 112/79 (BP Location: Left Arm)   Pulse 84   Temp 98.3 F (36.8 C) (Oral)   Resp 18   Ht 5' 5 (1.651 m)   Wt 230 lb (104.3 kg)   SpO2 96%   Breastfeeding No   BMI 38.27 kg/m   Visual Acuity Right Eye Distance:   Left Eye Distance:   Bilateral Distance:    Right Eye Near:   Left Eye Near:    Bilateral Near:     Physical Exam Vitals and nursing note reviewed.  Constitutional:      Appearance: She is not ill-appearing.  HENT:     Head: Normocephalic and atraumatic.     Right Ear: Tympanic membrane and ear canal normal.     Left Ear: Tympanic membrane and ear canal normal.     Mouth/Throat:     Mouth: Mucous membranes are moist. No oral lesions.     Pharynx: Uvula midline. No pharyngeal swelling, oropharyngeal exudate, posterior oropharyngeal erythema or uvula swelling.     Tonsils: No tonsillar exudate or tonsillar abscesses.  Cardiovascular:     Rate and Rhythm: Normal rate and regular rhythm.  Pulmonary:     Effort: Pulmonary effort is normal.     Breath sounds: Normal breath sounds.  Lymphadenopathy:     Cervical: No cervical adenopathy.  Neurological:     Mental Status: She is alert and oriented to person, place, and time.  Psychiatric:        Mood and Affect: Mood normal.      UC Treatments / Results  Labs (all labs ordered are listed, but only abnormal results are displayed) Labs Reviewed - No data to display  EKG  Radiology No results  found.  Procedures Procedures (including critical care time)  Medications Ordered in UC Medications - No data to display  Initial Impression / Assessment and Plan / UC Course  I have reviewed the triage vital signs and the nursing notes.  Pertinent labs & imaging results that were available during my care of the patient were reviewed by me and considered in my medical decision making (see chart for details).    Presents for evaluation of sore throat, cough and congestion that day 6.  Well-appearing, vital signs are stable, posterior pharynx is normal no erythema, exudate, swelling or evidence of peritonsillar abscess, normal swallowing, clear voice.  Vital signs are stable.  Point-of-care strep negative, point-of-care flu negative, point-of-care COVID-negative OTC meds for symptomatic treatment follow-up as needed Final Clinical Impressions(s) / UC Diagnoses   Final diagnoses:  None   Discharge Instructions   None    ED Prescriptions   None    PDMP not reviewed this encounter.   Silvestre Mines, [place] 07/12/23 1047
